# Patient Record
Sex: Male | Born: 1950
Health system: Southern US, Community
[De-identification: ages and names within clinical notes are randomized; demographics above are authoritative.]

## PROBLEM LIST (undated history)

## (undated) DIAGNOSIS — B1920 Unspecified viral hepatitis C without hepatic coma: Secondary | ICD-10-CM

## (undated) DIAGNOSIS — R569 Unspecified convulsions: Secondary | ICD-10-CM

## (undated) DIAGNOSIS — D649 Anemia, unspecified: Secondary | ICD-10-CM

## (undated) DIAGNOSIS — G629 Polyneuropathy, unspecified: Secondary | ICD-10-CM

## (undated) DIAGNOSIS — I10 Essential (primary) hypertension: Secondary | ICD-10-CM

## (undated) DIAGNOSIS — K859 Acute pancreatitis without necrosis or infection, unspecified: Secondary | ICD-10-CM

## (undated) HISTORY — DX: Unspecified viral hepatitis C without hepatic coma: B19.20

## (undated) HISTORY — DX: Essential (primary) hypertension: I10

## (undated) HISTORY — PX: NO PAST SURGERIES: SHX2092

---

## 2018-12-09 ENCOUNTER — Other Ambulatory Visit: Payer: Self-pay

## 2018-12-09 ENCOUNTER — Emergency Department
Admission: EM | Admit: 2018-12-09 | Discharge: 2018-12-09 | Discharge status: Against Medical Advice | Payer: Medicare Other | Attending: Internal Medicine | Admitting: Internal Medicine

## 2018-12-09 ENCOUNTER — Emergency Department: Payer: Medicare Other

## 2018-12-09 ENCOUNTER — Encounter: Payer: Self-pay | Admitting: Emergency Medicine

## 2018-12-09 DIAGNOSIS — Z720 Tobacco use: Secondary | ICD-10-CM | POA: Diagnosis not present

## 2018-12-09 DIAGNOSIS — R1013 Epigastric pain: Secondary | ICD-10-CM

## 2018-12-09 DIAGNOSIS — N3001 Acute cystitis with hematuria: Secondary | ICD-10-CM

## 2018-12-09 DIAGNOSIS — K861 Other chronic pancreatitis: Secondary | ICD-10-CM | POA: Diagnosis not present

## 2018-12-09 DIAGNOSIS — R52 Pain, unspecified: Secondary | ICD-10-CM | POA: Diagnosis not present

## 2018-12-09 DIAGNOSIS — I10 Essential (primary) hypertension: Secondary | ICD-10-CM | POA: Diagnosis not present

## 2018-12-09 DIAGNOSIS — Z5320 Procedure and treatment not carried out because of patient's decision for unspecified reasons: Secondary | ICD-10-CM | POA: Diagnosis not present

## 2018-12-09 DIAGNOSIS — K4090 Unilateral inguinal hernia, without obstruction or gangrene, not specified as recurrent: Secondary | ICD-10-CM

## 2018-12-09 DIAGNOSIS — F17200 Nicotine dependence, unspecified, uncomplicated: Secondary | ICD-10-CM | POA: Diagnosis not present

## 2018-12-09 DIAGNOSIS — R1084 Generalized abdominal pain: Secondary | ICD-10-CM | POA: Diagnosis not present

## 2018-12-09 DIAGNOSIS — R1010 Upper abdominal pain, unspecified: Secondary | ICD-10-CM | POA: Diagnosis not present

## 2018-12-09 DIAGNOSIS — N3000 Acute cystitis without hematuria: Secondary | ICD-10-CM | POA: Diagnosis not present

## 2018-12-09 DIAGNOSIS — R109 Unspecified abdominal pain: Secondary | ICD-10-CM | POA: Diagnosis present

## 2018-12-09 HISTORY — DX: Polyneuropathy, unspecified: G62.9

## 2018-12-09 LAB — CBC WITH DIFFERENTIAL/PLATELET
Abs Immature Granulocytes: 0.08 10*3/uL — ABNORMAL HIGH (ref 0.00–0.07)
Basophils Absolute: 0.1 10*3/uL (ref 0.0–0.1)
Basophils Relative: 1 %
Eosinophils Absolute: 0.1 10*3/uL (ref 0.0–0.5)
Eosinophils Relative: 1 %
HCT: 45 % (ref 39.0–52.0)
Hemoglobin: 15.1 g/dL (ref 13.0–17.0)
Immature Granulocytes: 1 %
Lymphocytes Relative: 17 %
Lymphs Abs: 2.7 10*3/uL (ref 0.7–4.0)
MCH: 23.7 pg — ABNORMAL LOW (ref 26.0–34.0)
MCHC: 33.6 g/dL (ref 30.0–36.0)
MCV: 70.6 fL — ABNORMAL LOW (ref 80.0–100.0)
Monocytes Absolute: 0.5 10*3/uL (ref 0.1–1.0)
Monocytes Relative: 3 %
Neutro Abs: 11.9 10*3/uL — ABNORMAL HIGH (ref 1.7–7.7)
Neutrophils Relative %: 77 %
Platelets: 261 10*3/uL (ref 150–400)
RBC: 6.37 MIL/uL — ABNORMAL HIGH (ref 4.22–5.81)
RDW: 17.4 % — ABNORMAL HIGH (ref 11.5–15.5)
WBC: 15.3 10*3/uL — ABNORMAL HIGH (ref 4.0–10.5)
nRBC: 0.1 % (ref 0.0–0.2)

## 2018-12-09 LAB — URINALYSIS, COMPLETE (UACMP) WITH MICROSCOPIC
BACTERIA UA: NONE SEEN
Bilirubin Urine: NEGATIVE
Glucose, UA: NEGATIVE mg/dL
Ketones, ur: 5 mg/dL — AB
Nitrite: NEGATIVE
PROTEIN: NEGATIVE mg/dL
RBC / HPF: >50 RBC/hpf — ABNORMAL HIGH (ref 0–5)
Specific Gravity, Urine: 1.016 (ref 1.005–1.030)
Squamous Epithelial / HPF: NONE SEEN (ref 0–5)
WBC, UA: >50 WBC/hpf — ABNORMAL HIGH (ref 0–5)
pH: 7 (ref 5.0–8.0)

## 2018-12-09 LAB — COMPREHENSIVE METABOLIC PANEL
ALT: 22 U/L (ref 0–44)
AST: 19 U/L (ref 15–41)
Albumin: 4.5 g/dL (ref 3.5–5.0)
Alkaline Phosphatase: 61 U/L (ref 38–126)
Anion gap: 9 (ref 5–15)
BUN: 13 mg/dL (ref 8–23)
CO2: 23 mmol/L (ref 22–32)
Calcium: 9.3 mg/dL (ref 8.9–10.3)
Chloride: 105 mmol/L (ref 98–111)
Creatinine, Ser: 0.78 mg/dL (ref 0.61–1.24)
GFR calc Af Amer: >60 mL/min (ref >60)
GFR calc non Af Amer: >60 mL/min (ref >60)
Glucose, Bld: 147 mg/dL — ABNORMAL HIGH (ref 70–99)
Potassium: 3.5 mmol/L (ref 3.5–5.1)
Sodium: 137 mmol/L (ref 135–145)
Total Bilirubin: 0.7 mg/dL (ref 0.3–1.2)
Total Protein: 7.7 g/dL (ref 6.5–8.1)

## 2018-12-09 LAB — LIPASE, BLOOD: Lipase: 43 U/L (ref 11–51)

## 2018-12-09 LAB — TROPONIN I: Troponin I: <0.03 ng/mL (ref <0.03)

## 2018-12-09 MED ORDER — SODIUM CHLORIDE 0.9 % IV BOLUS
1000.0000 mL | Freq: Once | INTRAVENOUS | Status: AC
  Administered 2018-12-09: 1000 mL via INTRAVENOUS

## 2018-12-09 MED ORDER — SODIUM CHLORIDE 0.9 % IV SOLN
1.0000 g | Freq: Once | INTRAVENOUS | Status: AC
  Administered 2018-12-09: 1 g via INTRAVENOUS
  Filled 2018-12-09: qty 10

## 2018-12-09 MED ORDER — FAMOTIDINE 20 MG PO TABS: 20.0000 mg | ORAL_TABLET | Freq: Every day | ORAL | 0 refills | Status: AC

## 2018-12-09 MED ORDER — KETOROLAC TROMETHAMINE 30 MG/ML IJ SOLN
15.0000 mg | INTRAMUSCULAR | Status: AC
  Administered 2018-12-09: 15 mg via INTRAVENOUS
  Filled 2018-12-09: qty 1

## 2018-12-09 MED ORDER — SUCRALFATE 1 G PO TABS
1.0000 g | ORAL_TABLET | Freq: Once | ORAL | Status: DC
  Filled 2018-12-09: qty 1

## 2018-12-09 MED ORDER — IOHEXOL 300 MG/ML  SOLN
100.0000 mL | Freq: Once | INTRAMUSCULAR | Status: AC | PRN
  Administered 2018-12-09: 100 mL via INTRAVENOUS

## 2018-12-09 MED ORDER — NICOTINE 21 MG/24HR TD PT24
21.0000 mg | MEDICATED_PATCH | Freq: Once | TRANSDERMAL | Status: DC
  Administered 2018-12-09: 21 mg via TRANSDERMAL
  Filled 2018-12-09: qty 1

## 2018-12-09 MED ORDER — PANTOPRAZOLE SODIUM 40 MG IV SOLR
40.0000 mg | Freq: Once | INTRAVENOUS | Status: AC
  Administered 2018-12-09: 40 mg via INTRAVENOUS
  Filled 2018-12-09: qty 40

## 2018-12-09 MED ORDER — CEPHALEXIN 500 MG PO CAPS: 500.0000 mg | ORAL_CAPSULE | Freq: Three times a day (TID) | ORAL | 0 refills | Status: AC

## 2018-12-09 NOTE — ED Notes (Signed)
Patient remains undecided as to whether he will stay to be admitted or not. Will continue to follow up

## 2018-12-09 NOTE — H&P (Signed)
[facility] [place] [facility]   PATIENT NAME: Tom Pace    MR#:  [id number]  DATE OF BIRTH:  1950-12-08  DATE OF ADMISSION:  12/09/2018  PRIMARY CARE PHYSICIAN: Patient, No Pcp Per   REQUESTING/REFERRING PHYSICIAN: Dr Sharman Cheek  CHIEF COMPLAINT:   Chief Complaint  Patient presents with  . Abdominal Pain    HISTORY OF PRESENT ILLNESS:  Tom Pace  is a 68 y.o. male presents with abdominal pain.  He thought it was his hiatal hernia that is been acting up.  But any position that he goes into does not make it better.  A lot of times when he coughs it can pop out.  Pain was 10 out of 10 when he came in.  He has been having poor appetite and nausea.  Patient's pain is in the epigastric area.  He has been having pain over the last 2 to 3 days.  It got so bad last night that he had come into the hospital.  No weight loss.  No vomiting.  No diarrhea or constipation.  No blood in the bowel movements.  CT scan consistent with chronic pancreatitis and did have a cyst on the pancreas.  Lipase was normal.  Positive for acute cystitis on urine analysis.  Patient not sure if he wants to come into the hospital.  Case discussed with daytime ER physician and they will let me know if the patient wants to come in for an observation.  Otherwise they will send the patient home from the emergency room.  PAST MEDICAL HISTORY:   Past Medical History:  Diagnosis Date  . Neuropathy     PAST SURGICAL HISTORY:   Past Surgical History:  Procedure Laterality Date  . NO PAST SURGERIES      SOCIAL HISTORY:   Social History   Tobacco Use  . Smoking status: Current Every Day Smoker  . Smokeless tobacco: Never Used  Substance Use Topics  . Alcohol use: Not Currently    FAMILY HISTORY:   Family History  Problem Relation Age of Onset  . Diabetes Mother   . Osteoporosis Mother   . Suicidality Father     DRUG ALLERGIES:  No Known Allergies  REVIEW OF  SYSTEMS:  CONSTITUTIONAL: No fever, fatigue or weakness.  EYES: No blurred or double vision.  EARS, NOSE, AND THROAT: No tinnitus or ear pain. No sore throat RESPIRATORY: No cough, shortness of breath, wheezing or hemoptysis.  CARDIOVASCULAR: No chest pain, orthopnea, edema.  GASTROINTESTINAL: Positive nausea, No vomiting. Positive abdominal pain. No blood in bowel movements GENITOURINARY: No dysuria, hematuria.  ENDOCRINE: No polyuria, nocturia,  HEMATOLOGY: No anemia, easy bruising or bleeding SKIN: No rash or lesion. MUSCULOSKELETAL: Positive neuropathy  NEUROLOGIC: No tingling, numbness, weakness.  PSYCHIATRY: Hx depression. No thoughts of hurting himself or others  MEDICATIONS AT HOME:   Prior to Admission medications   Medication Sig Start Date End Date Taking? Authorizing Provider  oxymetazoline (AFRIN) 0.05 % nasal spray Place 1 spray into both nostrils 2 (two) times daily.   Yes [provider]      VITAL SIGNS:  Blood pressure (!) 151/93, pulse 90, temperature 98.2 F (36.8 C), temperature source Oral, resp. rate 20, height 5\' 6"  (1.676 m), weight 65.8 kg, SpO2 100 %.  PHYSICAL EXAMINATION:  GENERAL:  68 y.o.-year-old patient lying in the bed with no acute distress.  EYES: Pupils equal, round, reactive to light and accommodation. No scleral icterus. Extraocular muscles intact.  HEENT: Head atraumatic, normocephalic. Oropharynx and nasopharynx clear.  NECK:  Supple, no jugular venous distention. No thyroid enlargement, no tenderness.  LUNGS: Normal breath sounds bilaterally, no wheezing, rales,rhonchi or crepitation. No use of accessory muscles of respiration.  CARDIOVASCULAR: S1, S2 normal. No murmurs, rubs, or gallops.  ABDOMEN: Soft, slight epigatric tenderness, nondistended. Bowel sounds present. No organomegaly or mass.  EXTREMITIES: No pedal edema, cyanosis, or clubbing.  NEUROLOGIC: Cranial nerves II through XII are intact. Muscle strength 5/5 in all  extremities. Sensation intact. Gait not checked.  PSYCHIATRIC: The patient is alert and oriented x 3.  SKIN: No rash, lesion, or ulcer.   LABORATORY PANEL:   CBC Recent Labs  Lab 12/09/18 0140  WBC 15.3*  HGB 15.1  HCT 45.0  PLT 261   ------------------------------------------------------------------------------------------------------------------  Chemistries  Recent Labs  Lab 12/09/18 0140  NA 137  K 3.5  CL 105  CO2 23  GLUCOSE 147*  BUN 13  CREATININE 0.78  CALCIUM 9.3  AST 19  ALT 22  ALKPHOS 61  BILITOT 0.7   ------------------------------------------------------------------------------------------------------------------  Cardiac Enzymes Recent Labs  Lab 12/09/18 0140  TROPONINI <0.03   ------------------------------------------------------------------------------------------------------------------  RADIOLOGY:  Ct Abdomen Pelvis W Contrast  Result Date: 12/09/2018 CLINICAL DATA:  Mid upper abdominal pain for several months exacerbated by eating EXAM: CT ABDOMEN AND PELVIS WITH CONTRAST TECHNIQUE: Multidetector CT imaging of the abdomen and pelvis was performed using the standard protocol following bolus administration of intravenous contrast. CONTRAST:  OMNIPAQUE IOHEXOL 300 MG/ML  SOLN COMPARISON:  None. FINDINGS: Lower chest:  Negative Hepatobiliary: There are a few tiny low densities in the liver that are much too small for densitometry.No evidence of biliary obstruction or stone. Pancreas: Scattered coarse calcifications. No ductal dilatation or beading. 8 mm low-density lesion over the pancreatic tail that is cystic density on reformats. Spleen: Unremarkable. Adrenals/Urinary Tract: Negative adrenals. No hydronephrosis or ureteral stone. 6.1 cm left upper pole cyst with benign appearing minimal peripheral calcification. There are additional tiny low densities in the kidneys that are likely cystic. Indeterminate lesion in the right renal upper pole  measuring 1 cm. Unremarkable bladder. Stomach/Bowel: No obstruction. No evidence of bowel inflammation. Mild left colonic diverticulosis. Vascular/Lymphatic: Extensive atherosclerotic plaque with generalized narrowing of the bilateral iliacs. Celiac appears to be widely patent on sagittal reformats. There is at least moderate narrowing at the origin of the SMA. No mass or adenopathy. Reproductive:Enlarged prostate with dystrophic calcifications. Other: No ascites or pneumoperitoneum. Musculoskeletal: Advanced L4-5 disc degeneration. L3-4 mild anterolisthesis. IMPRESSION: 1. No acute finding. 2. Pancreatic calcifications suggesting chronic pancreatitis. No evidence of superimposed acute inflammation. 3. Indeterminate 1 cm right renal lesion. Recommend elective MRI with contrast. At the same time, attention to a 8 mm probable cyst in the pancreatic tail. 4. Advanced atherosclerosis. There is SMA origin narrowing but no celiac stenosis. Electronically Signed   By: Marnee Spring M.D.   On: 12/09/2018 04:25    EKG:   Normal sinus rhythm 81 bpm biatrial enlargement  IMPRESSION AND PLAN:   1.  Epigastric abdominal pain.  Patient thinks it could be a hiatal hernia.  Could also be ulcer disease.  Less likely chronic pancreatitis flare secondary to patient feeling better after pain medications.  Patient not sure if he wants to come into the hospital.  ER physician to evaluate.  Recommend PPI.  I offered to get gastroenterology to see him for an endoscopy but the patient is not sure yet. 2.  Acute cystitis, with  leukocytosis.  Given Rocephin.  Follow-up urine culture.  Recommend antibiotic upon going home. 3.  Depression.  Patient does not want to see a psychiatrist. 4.  Tobacco abuse.  Smoking cessation counseling done 4 minutes by me.  Nicotine patch ordered    All the records are reviewed and case discussed with ED provider.  This will be billed as an ER consult if the patient does not want to come into  the hospital. Management plans discussed with the patient, and he is going to decide what he wants to do.  CODE STATUS: Full code  TOTAL TIME TAKING CARE OF THIS PATIENT: 50 minutes.    Alford Highland M.D on 12/09/2018 at 8:24 AM  Between 7am to 6pm - Pager - [phone]  After 6pm call admission pager [phone]  [facility]  [phone]  CC: Primary care physician; Patient, No Pcp Per

## 2018-12-09 NOTE — ED Triage Notes (Signed)
Pt to triage via w/c with no distress noted, brought in by EMS; pt reports mid upper abd pain for several months accomp by nausea with eating

## 2018-12-09 NOTE — ED Provider Notes (Signed)
[facility] Emergency Department Provider Note  ____________________________________________  Time seen: Approximately 6:51 AM  I have reviewed the triage vital signs and the nursing notes.   HISTORY  Chief Complaint Abdominal Pain    HPI Tom GRAUS is a 68 y.o. male with no significant past medical history who complains of epigastric pain for the past 2 to 3 months, worse with eating, associated with nausea.  No vomiting diarrhea or constipation.  No black or bloody stool.  No fevers or chills.  Denies weight loss but does mention that he has noticed generalized muscle atrophy.  Pain is nonradiating.  Denies dysuria but reports occasionally seeing hematuria.      History reviewed. No pertinent past medical history.   There are no active problems to display for this patient.    History reviewed. No pertinent surgical history.   Prior to Admission medications   Not on File  None   Allergies Patient has no known allergies.   No family history on file.  Social History Social History   Tobacco Use  . Smoking status: Current Every Day Smoker  . Smokeless tobacco: Never Used  Substance Use Topics  . Alcohol use: Not on file  . Drug use: Not on file    Review of Systems  Constitutional:   No fever or chills.  ENT:   No sore throat. No rhinorrhea. Cardiovascular:   No chest pain or syncope. Respiratory:   No dyspnea or cough. Gastrointestinal: Positive as above abdominal pain without vomiting and diarrhea.  Musculoskeletal:   Negative for focal pain or swelling All other systems reviewed and are negative except as documented above in ROS and HPI.  ____________________________________________   PHYSICAL EXAM:  VITAL SIGNS: ED Triage Vitals  Enc Vitals Group     BP 12/09/18 0140 (!) 148/101     Pulse Rate 12/09/18 0140 85     Resp 12/09/18 0140 18     Temp 12/09/18 0140 98.2 F (36.8 C)     Temp Source 12/09/18 0140 Oral      SpO2 12/09/18 0140 97 %     Weight 12/09/18 0139 145 lb (65.8 kg)     Height 12/09/18 0139 5\' 6"  (1.676 m)     Head Circumference --      Peak Flow --      Pain Score 12/09/18 0139 10     Pain Loc --      Pain Edu? --      Excl. in GC? --     Vital signs reviewed, nursing assessments reviewed.   Constitutional:   Alert and oriented. Non-toxic appearance. Eyes:   Conjunctivae are normal. EOMI. PERRL. ENT      Head:   Normocephalic and atraumatic.      Nose:   No congestion/rhinnorhea.       Mouth/Throat:   Dry mucous membranes, no pharyngeal erythema. No peritonsillar mass.       Neck:   No meningismus. Full ROM. Hematological/Lymphatic/Immunilogical:   No cervical lymphadenopathy. Cardiovascular:   RRR. Symmetric bilateral radial and DP pulses.  No murmurs. Cap refill less than 2 seconds. Respiratory:   Normal respiratory effort without tachypnea/retractions. Breath sounds are clear and equal bilaterally. No wheezes/rales/rhonchi. Gastrointestinal:   Soft with epigastric tenderness and prominent aortic pulsations.. Non distended. There is no CVA tenderness.  No rebound, rigidity, or guarding.  There is a direct right inguinal hernia that is easily reducible. Genitourinary:   Normal Musculoskeletal:   Normal  range of motion in all extremities. No joint effusions.  No lower extremity tenderness.  No edema. Neurologic:   Normal speech and language.  Motor grossly intact. No acute focal neurologic deficits are appreciated.  Skin:    Skin is warm, dry and intact. No rash noted.  No petechiae, purpura, or bullae.  ____________________________________________    LABS (pertinent positives/negatives) (all labs ordered are listed, but only abnormal results are displayed) Labs Reviewed  CBC WITH DIFFERENTIAL/PLATELET - Abnormal; Notable for the following components:      Result Value   WBC 15.3 (*)    RBC 6.37 (*)    MCV 70.6 (*)    MCH 23.7 (*)    RDW 17.4 (*)    Neutro Abs 11.9  (*)    Abs Immature Granulocytes 0.08 (*)    All other components within normal limits  COMPREHENSIVE METABOLIC PANEL - Abnormal; Notable for the following components:   Glucose, Bld 147 (*)    All other components within normal limits  URINALYSIS, COMPLETE (UACMP) WITH MICROSCOPIC - Abnormal; Notable for the following components:   Color, Urine YELLOW (*)    APPearance HAZY (*)    Hgb urine dipstick MODERATE (*)    Ketones, ur 5 (*)    Leukocytes,Ua LARGE (*)    RBC / HPF >50 (*)    WBC, UA >50 (*)    All other components within normal limits  URINE CULTURE  LIPASE, BLOOD  TROPONIN I   ____________________________________________   EKG  Interpreted by me Normal sinus rhythm rate of 81, normal axis and intervals.  Normal QRS ST segments and T waves.  ____________________________________________    RADIOLOGY  Ct Abdomen Pelvis W Contrast  Result Date: 12/09/2018 CLINICAL DATA:  Mid upper abdominal pain for several months exacerbated by eating EXAM: CT ABDOMEN AND PELVIS WITH CONTRAST TECHNIQUE: Multidetector CT imaging of the abdomen and pelvis was performed using the standard protocol following bolus administration of intravenous contrast. CONTRAST:  100mL OMNIPAQUE IOHEXOL 300 MG/ML  SOLN COMPARISON:  None. FINDINGS: Lower chest:  Negative Hepatobiliary: There are a few tiny low densities in the liver that are much too small for densitometry.No evidence of biliary obstruction or stone. Pancreas: Scattered coarse calcifications. No ductal dilatation or beading. 8 mm low-density lesion over the pancreatic tail that is cystic density on reformats. Spleen: Unremarkable. Adrenals/Urinary Tract: Negative adrenals. No hydronephrosis or ureteral stone. 6.1 cm left upper pole cyst with benign appearing minimal peripheral calcification. There are additional tiny low densities in the kidneys that are likely cystic. Indeterminate lesion in the right renal upper pole measuring 1 cm.  Unremarkable bladder. Stomach/Bowel: No obstruction. No evidence of bowel inflammation. Mild left colonic diverticulosis. Vascular/Lymphatic: Extensive atherosclerotic plaque with generalized narrowing of the bilateral iliacs. Celiac appears to be widely patent on sagittal reformats. There is at least moderate narrowing at the origin of the SMA. No mass or adenopathy. Reproductive:Enlarged prostate with dystrophic calcifications. Other: No ascites or pneumoperitoneum. Musculoskeletal: Advanced L4-5 disc degeneration. L3-4 mild anterolisthesis. IMPRESSION: 1. No acute finding. 2. Pancreatic calcifications suggesting chronic pancreatitis. No evidence of superimposed acute inflammation. 3. Indeterminate 1 cm right renal lesion. Recommend elective MRI with contrast. At the same time, attention to a 8 mm probable cyst in the pancreatic tail. 4. Advanced atherosclerosis. There is SMA origin narrowing but no celiac stenosis. Electronically Signed   By: Marnee SpringJonathon  Watts M.D.   On: 12/09/2018 04:25    ____________________________________________   PROCEDURES Procedures  ____________________________________________  DIFFERENTIAL  DIAGNOSIS  Pancreatitis, cholecystitis, choledocholithiasis, intra-abdominal tumor, AAA, GERD  CLINICAL IMPRESSION / ASSESSMENT AND PLAN / ED COURSE  Medications ordered in the ED: Medications  sucralfate (CARAFATE) tablet 1 g (1 g Oral Refused 12/09/18 0456)  nicotine (NICODERM CQ - dosed in mg/24 hours) patch 21 mg (has no administration in time range)  ketorolac (TORADOL) 30 MG/ML injection 15 mg (15 mg Intravenous Given 12/09/18 0431)  sodium chloride 0.9 % bolus 1,000 mL (0 mLs Intravenous Stopped 12/09/18 0534)  iohexol (OMNIPAQUE) 300 MG/ML solution 100 mL (100 mLs Intravenous Contrast Given 12/09/18 0404)  pantoprazole (PROTONIX) injection 40 mg (40 mg Intravenous Given 12/09/18 0455)  cefTRIAXone (ROCEPHIN) 1 g in sodium chloride 0.9 % 100 mL IVPB (0 g Intravenous Stopped  12/09/18 0532)    Pertinent labs & imaging results that were available during my care of the patient were reviewed by me and considered in my medical decision making (see chart for details).    Patient presents with chronic intermittent epigastric pain.  Prior to the last few months this is never happened before.  Labs unremarkable except urinalysis which which shows a large amount of red blood cells or white blood cells, concerning for occult infection.  No evidence of pyelonephritis, not septic.  CT scan obtained which shows chronic pancreatitis as well as a 1 cm renal cyst.  Due to the ongoing symptoms and no clear explanation and his lack of outpatient follow-up (he reports that the PCP his insurance plan assigned him to is not currently accepting new patients), I think it prudent to hospitalize him for symptom control and further work-up.  CT finding of SMA stenosis I think is not clinically significant at this time.    ----------------------------------------- 6:57 AM on 12/09/2018 -----------------------------------------  Discussed with Dr. Arville Care hospitalist      ____________________________________________   FINAL CLINICAL IMPRESSION(S) / ED DIAGNOSES    Final diagnoses:  Epigastric pain  Chronic pancreatitis, unspecified pancreatitis type [facility])     ED Discharge Orders    None      Portions of this note were generated with dragon dictation software. Dictation errors may occur despite best attempts at proofreading.   Sharman Cheek, MD 12/09/18 [phone]

## 2018-12-09 NOTE — ED Notes (Signed)
Dr. Stafford at bedside.  

## 2018-12-09 NOTE — ED Provider Notes (Signed)
_________________________ 9:12 AM on 12/09/2018 -----------------------------------------  Patient admitted by Dr. staff for for UTI and acute on chronic pancreatitis.  Patient was evaluated by Dr. Hennie Duos for admission however at that time patient was requesting to be discharged.  Patient wishes to be discharged AGAINST MEDICAL ADVICE.  Does not want to stay in the hospital at this time since he feels markedly improved.  He is tolerating p.o. with no episodes of vomiting.  Patient received Rocephin for UTI.  Will send him home on Pepcid, bland diet, Keflex for UTI and possible GERD/gastritis.  Recommended follow-up with his primary care doctor.  Discussed in a return precautions.   Don Perking, [place], MD 12/09/18 [phone]

## 2018-12-11 ENCOUNTER — Other Ambulatory Visit: Payer: Self-pay

## 2018-12-11 ENCOUNTER — Emergency Department
Admission: EM | Admit: 2018-12-11 | Discharge: 2018-12-11 | Disposition: A | Discharge status: Home / Self Care | Payer: Medicare Other | Attending: Emergency Medicine | Admitting: Emergency Medicine

## 2018-12-11 ENCOUNTER — Encounter: Payer: Self-pay | Admitting: *Deleted

## 2018-12-11 DIAGNOSIS — R001 Bradycardia, unspecified: Secondary | ICD-10-CM

## 2018-12-11 DIAGNOSIS — F17200 Nicotine dependence, unspecified, uncomplicated: Secondary | ICD-10-CM | POA: Insufficient documentation

## 2018-12-11 DIAGNOSIS — R110 Nausea: Secondary | ICD-10-CM | POA: Diagnosis not present

## 2018-12-11 DIAGNOSIS — R1013 Epigastric pain: Secondary | ICD-10-CM | POA: Diagnosis present

## 2018-12-11 LAB — COMPREHENSIVE METABOLIC PANEL
ALT: 25 U/L (ref 0–44)
ANION GAP: 10 (ref 5–15)
AST: 22 U/L (ref 15–41)
Albumin: 4.3 g/dL (ref 3.5–5.0)
Alkaline Phosphatase: 61 U/L (ref 38–126)
BUN: 18 mg/dL (ref 8–23)
CO2: 23 mmol/L (ref 22–32)
Calcium: 9.5 mg/dL (ref 8.9–10.3)
Chloride: 107 mmol/L (ref 98–111)
Creatinine, Ser: 0.72 mg/dL (ref 0.61–1.24)
GFR calc Af Amer: >60 mL/min (ref >60)
GFR calc non Af Amer: >60 mL/min (ref >60)
Glucose, Bld: 98 mg/dL (ref 70–99)
Potassium: 3.6 mmol/L (ref 3.5–5.1)
Sodium: 140 mmol/L (ref 135–145)
Total Bilirubin: 1 mg/dL (ref 0.3–1.2)
Total Protein: 8 g/dL (ref 6.5–8.1)

## 2018-12-11 LAB — CBC
HCT: 44.7 % (ref 39.0–52.0)
Hemoglobin: 14.9 g/dL (ref 13.0–17.0)
MCH: 23.7 pg — ABNORMAL LOW (ref 26.0–34.0)
MCHC: 33.3 g/dL (ref 30.0–36.0)
MCV: 71.1 fL — ABNORMAL LOW (ref 80.0–100.0)
NRBC: 0 % (ref 0.0–0.2)
Platelets: 258 10*3/uL (ref 150–400)
RBC: 6.29 MIL/uL — AB (ref 4.22–5.81)
RDW: 17.1 % — ABNORMAL HIGH (ref 11.5–15.5)
WBC: 15.1 10*3/uL — ABNORMAL HIGH (ref 4.0–10.5)

## 2018-12-11 LAB — URINE CULTURE: Culture: 10000 — AB

## 2018-12-11 LAB — LIPASE, BLOOD: Lipase: 33 U/L (ref 11–51)

## 2018-12-11 MED ORDER — HYDROMORPHONE HCL 1 MG/ML IJ SOLN
1.0000 mg | Freq: Once | INTRAMUSCULAR | Status: AC
  Administered 2018-12-11: 1 mg via INTRAVENOUS
  Filled 2018-12-11: qty 1

## 2018-12-11 MED ORDER — OXYCODONE HCL 5 MG PO TABS: 5.0000 mg | ORAL_TABLET | Freq: Four times a day (QID) | ORAL | 0 refills | Status: AC | PRN

## 2018-12-11 MED ORDER — SODIUM CHLORIDE 0.9 % IV BOLUS
1000.0000 mL | Freq: Once | INTRAVENOUS | Status: AC
  Administered 2018-12-11: 1000 mL via INTRAVENOUS

## 2018-12-11 MED ORDER — ONDANSETRON 4 MG PO TBDP: 4.0000 mg | ORAL_TABLET | Freq: Three times a day (TID) | ORAL | 0 refills | Status: AC | PRN

## 2018-12-11 MED ORDER — ONDANSETRON HCL 4 MG/2ML IJ SOLN
4.0000 mg | Freq: Once | INTRAMUSCULAR | Status: AC
  Administered 2018-12-11: 4 mg via INTRAVENOUS
  Filled 2018-12-11: qty 2

## 2018-12-11 NOTE — ED Notes (Addendum)
Upon review of the chart admission was recommended but pt refused. Pt was dx with pancreatitis. RN asked pt about this and he stated, "oh yea they did say that, I am just sure it has to be a hernia."

## 2018-12-11 NOTE — Discharge Instructions (Addendum)
The day her heart rate is a little bit low.  This may be normal for you.  However, if you develop lightheadedness or fainting, shortness of breath, chest pain or palpitations, please see your doctor, cardiologist, or if you feel the symptoms are severe come back to the emergency department.  Please take a PPI and pancreatic enzymes as instructed by your GI physicians assistant today.  Please make a follow-up as discussed as well.  In addition, you may take Tylenol but avoid all NSAID medications including ibuprofen for mild to moderate pain.  If you have severe pain you may take oxycodone.  Do not drive within 8 hours of taking oxycodone.  Please follow the dietary instructions as follows: Take a clear liquid diet for the next 3 days, then advance to a full diet for 2 days, then advance to a bland diet as tolerated.  If you begin having pain again, go back to the clear liquid diet.  Please continue to take the Keflex for your urinary tract infection as directed.  Return to the emergency department if you develop uncontrolled abdominal pain despite medications, inability to keep down fluids, fever, or any other symptoms concerning to you.

## 2018-12-11 NOTE — ED Provider Notes (Signed)
[facility] Emergency Department Provider Note  ____________________________________________  Time seen: Approximately 12:22 PM  I have reviewed the triage vital signs and the nursing notes.   HISTORY  Chief Complaint Abdominal Pain and Nausea    HPI Tom Pace is a 68 y.o. male sent from the GI clinic for epigastric pain and nausea.  The patient was seen and evaluated 12/09/2018 and diagnosed with pancreatitis as well as a UTI; he left AGAINST MEDICAL ADVICE at the time of admission.  He states that he is been doing well at home, has been taking Keflex for his urinary tract infection and Pepcid for his epigastric pain.  This morning, he had a single sneeze and this resulted in a severe epigastric pain associate with nausea but no vomiting that has not gone away.  He went for evaluation at the [facility] clinic today and was referred to the emergency department.  He has not had any fevers or chills, cough or cold symptoms, constipation or diarrhea.  He denies alcohol use.  Past Medical History:  Diagnosis Date  . Neuropathy     There are no active problems to display for this patient.   Past Surgical History:  Procedure Laterality Date  . NO PAST SURGERIES      Current Outpatient Rx  . Order #: [id number] Class: Print  . Order #: [id number] Class: Print  . Order #: [id number] Class: Historical Med    Allergies Patient has no known allergies.  Family History  Problem Relation Age of Onset  . Diabetes Mother   . Osteoporosis Mother   . Suicidality Father     Social History Social History   Tobacco Use  . Smoking status: Current Every Day Smoker  . Smokeless tobacco: Never Used  Substance Use Topics  . Alcohol use: Not Currently  . Drug use: Not Currently    Review of Systems Constitutional: No fever/chills.  Lightheadedness or syncope. Eyes: No visual changes. ENT: No sore throat. No congestion or rhinorrhea. Cardiovascular: Denies chest  pain. Denies palpitations. Respiratory: Denies shortness of breath.  No cough. Gastrointestinal: Positive epigastric abdominal pain.  Positive nausea, no vomiting.  No diarrhea.  No constipation. Genitourinary: Positive under treatment with Keflex for UTI. Musculoskeletal: Negative for back pain. Skin: Negative for rash. Neurological: Negative for headaches. No focal numbness, tingling or weakness.     ____________________________________________   PHYSICAL EXAM:  VITAL SIGNS: ED Triage Vitals  Enc Vitals Group     BP 12/11/18 1127 (!) 164/100     Pulse Rate 12/11/18 1127 77     Resp 12/11/18 1127 16     Temp 12/11/18 1127 98.7 F (37.1 C)     Temp Source 12/11/18 1127 Oral     SpO2 12/11/18 1127 100 %     Weight 12/11/18 1128 145 lb (65.8 kg)     Height 12/11/18 1128 5\' 6"  (1.676 m)     Head Circumference --      Peak Flow --      Pain Score 12/11/18 1127 9     Pain Loc --      Pain Edu? --      Excl. in GC? --     Constitutional: Alert and oriented.Answers questions appropriately. Eyes: Conjunctivae are normal.  EOMI. No scleral icterus. Head: Atraumatic. Nose: No congestion/rhinnorhea. Mouth/Throat: Mucous membranes are moist.  Neck: No stridor.  Supple.  No meningismus. Cardiovascular: Normal rate, regular rhythm. No murmurs, rubs or gallops.  Respiratory: Normal respiratory effort.  No  accessory muscle use or retractions. Lungs CTAB.  No wheezes, rales or ronchi. Gastrointestinal: Soft, and nondistended.  Intimal tenderness to palpation in the epigastrium.  Negative Murphy sign.  No guarding or rebound.  No peritoneal signs. Musculoskeletal: No LE edema.Neurologic:  A&Ox3.  Speech is clear.  Face and smile are symmetric.  EOMI.  Moves all extremities well. Skin:  Skin is warm, dry and intact. No rash noted. Psychiatric: Mood and affect are normal.   ____________________________________________   LABS (all labs ordered are listed, but only abnormal results are  displayed)  Labs Reviewed  CBC - Abnormal; Notable for the following components:      Result Value   WBC 15.1 (*)    RBC 6.29 (*)    MCV 71.1 (*)    MCH 23.7 (*)    RDW 17.1 (*)    All other components within normal limits  LIPASE, BLOOD  COMPREHENSIVE METABOLIC PANEL  URINALYSIS, COMPLETE (UACMP) WITH MICROSCOPIC   ____________________________________________  EKG  ED ECG REPORT I, Anne-Caroline Sharma Covert, the attending physician, personally viewed and interpreted this ECG.   Date: 12/11/2018  EKG Time: 1259  Rate: 51  Rhythm: sinus bradycardia  Axis: normal  Intervals:none  ST&T Change: No STEMI  ____________________________________________  RADIOLOGY  No results found.  ____________________________________________   PROCEDURES  Procedure(s) performed: None  Procedures  Critical Care performed: No ____________________________________________   INITIAL IMPRESSION / ASSESSMENT AND PLAN / ED COURSE  Pertinent labs & imaging results that were available during my care of the patient were reviewed by me and considered in my medical decision making (see chart for details).  68 y.o. male with recent diagnosis of acute on chronic pancreatitis and UTI presenting after epigastric pain with nausea and vomiting which restarted this morning.  Check basic laboratory studies and initiate symptomatic treatment, and I will talk to the GI physicians to make a final disposition plan.  ----------------------------------------- 1:04 PM on 12/11/2018 -----------------------------------------  The patient's work-up in the emergency department shows a white blood cell count of 15.1 which is unchanged compared to his prior visit.  His electrolytes are reassuring.  His lipase is 33 today.  Results of his UA.  I have made a consult and call to GI and I am waiting back to hear from them.  Patient does have sinus bradycardia on his EKG and I do not see that he is on a beta-blocker.   His last heart rate when he was seen here prior was 85.  I have let him know that his heart rate is a little low, and that this may be normal for him, but that if he develops any lightheadedness, shortness of breath, syncope, or chest pain he needs to be evaluated by a primary care physician, cardiologist, or if he feels his symptoms are severe that he needs to return to the emergency department.  ----------------------------------------- 1:31 PM on 12/11/2018 -----------------------------------------  I have spoken with Dr. Daleen Squibb, the GI physician on-call.  He is not in the group that follows this gentleman, but I have reviewed the patient's history and findings today and he is in agreement with the plan for discharge.  The patient is tolerating liquids at this time and his pain has improved.  I will have had a thorough discussion with the patient about his pain management plan.  This includes Tylenol as needed for mild to moderate pain and avoidance of all NSAID medications.  In addition, he will need to continue to take his PPI and  pancreatic enzyme as prescribed by his GI PA today.  He will be prescribed Zofran for any nausea.  He will follow-up with his GI team.  The patient has not given a urine specimen at this time but states he is only taken 1 dose of his antibiotic; I have reviewed his microbiology which did show a pansensitive staph infection.  I anticipate the Keflex will be appropriate treatment and have asked him to continue taking this medication; will not repeat his UA today.  At this time the patient is safe for discharge home.  Follow-up instructions as well as return precautions were discussed.  ____________________________________________  FINAL CLINICAL IMPRESSION(S) / ED DIAGNOSES  Final diagnoses:  Sinus bradycardia  Epigastric pain  Nausea without vomiting         NEW MEDICATIONS STARTED DURING THIS VISIT:  New Prescriptions   No medications on file      Rockne Menghini, MD 12/11/18 1333

## 2018-12-11 NOTE — ED Notes (Signed)
Patient reports abd pain x few days, labs drawn and sent. EKG given to Md. meds given as ordered.

## 2018-12-11 NOTE — ED Triage Notes (Signed)
Brought by [facility] for abd pain and wretching.  Says he coughed about 7am and then had intense pain in upper mid abd.  Says he has had this many times over the past several months.

## 2018-12-11 NOTE — ED Triage Notes (Signed)
Pt to ED from home reporting upper abd pain that has been intermittent for the past 8 months. Pain and nausea worsen after coughing and pt reports feeling a "bulge like a hernia" after coughing. Pain worsened this Monday and pt verbalized having to call EMS. Pt was evaluated in ED and was unable to tell this RN a particular dx. Pain continues to worsen today with persistant nausea.

## 2018-12-12 ENCOUNTER — Other Ambulatory Visit: Payer: Self-pay | Admitting: *Deleted

## 2018-12-12 NOTE — Patient Outreach (Addendum)
[facility]) Care Management  12/12/2018  Tom Pace 14-Sep-1951 [id number]   Subjective: Telephone call to patient's home number, spoke with patient, and HIPAA verified.  Discussed [facility] Care Management [employer] Referral follow up, patient voiced understanding, and is in agreement to follow up.  Patient states he is feeling much better, had a ED visit yesterday for pancreatitis, and was able to drive himself home from the emergency room.  States he remember speaking with the referral source, he is  planning to contact [employer] for a list of in network primary care providers that are accepting new patients, do not need  assistance at this time with finding a primary MD, and will notify [facility] Care Management if assistance needed. Discussed importance of hospital follow up with primary MD, patient voices understanding, and states he will follow up as appropriate.  Patient states he has been in the area for approximately 14 years, stays to himself, does not have friends/ family that are close by, when he has a flare with pancreatitis, may need assistance with transportation to appointments, has his own car, and is in agreement to referral to [facility] Care Management Social Worker for transportation assistance.   Patient also scored 18 on PHQ-9, in agreement to a referral to [facility] Care Management Social Worker for mental health resource identification and follow up.  Patient will also discuss mental health resource needs with primary MD once care established.  States he periodically has balance issues at time due to neuropathy and is planning to discuss with primary provider when care established.  Patient states he does not have any education material, transition of care, care coordination, disease management, disease monitoring, transportation, community resource, or pharmacy needs at this time.  States he is very appreciative of the follow up and is in agreement to receive [facility] Care  Management services. RNCM advised patient to notify MD of any changes in condition prior to scheduled appointment. RNCM provided contact name and number: [phone] or main office number [phone] and 24 hour nurse advise line [phone].      Objective:  Per KPN (Knowledge Performance Now, point of care tool) and chart review, patient had ED visit on 12/09/2018 for pancreatitis.  Patient had ED visit on 12/11/2018 for sinus bradycardia, Epigastric pain and nausea.     Patient has a history of neuropathy.        Assessment: Received [employer] Management referral on 12/11/2018.   Referral source: Clydene Fake.    Referral reason: Transportation assistance and has been given contact information for 2 clinic's to follow up to obtain primary MD.   Screening follow up completed and will refer patient to [facility] Care Management Social Worker for transportation assistance and Corporate investment banker.     Plan: RNCM will refer patient to [facility] Care Management Social Worker for transportation assistance and Corporate investment banker. RNCM will send patient Advanced Directives packet per his request.     Zara Wendt H. Gardiner Barefoot, BSN, CCM [facility] Care Management [facility] Telephonic CM Phone: [phone] Fax: [phone]

## 2018-12-16 ENCOUNTER — Telehealth: Payer: Self-pay | Admitting: *Deleted

## 2018-12-16 ENCOUNTER — Other Ambulatory Visit: Payer: Self-pay | Admitting: *Deleted

## 2018-12-16 NOTE — Patient Outreach (Signed)
[facility]) Care Management  12/16/2018  Tom Pace Jan 02, 1951 [id number]   CSW received referral from [facility] RNCM, Darlene for transportation assistance. Patient also agreeable to mental health resource identification and follow up. CSW called & spoke with patient who states that though he does still have a car and is able to get around to run errands & go to doctors appointments on his own, he does forsee that with his neuropathy, that he will likely need assistance with transportation in the future. CSW spoke with patient about community resources such as Biomedical scientist ([employer]) and benefits through his insurance [facility] [employer]) - CSW will have resources mailed to patient.   Patient shared that he does have his initial PCP appointment with Dr. Dierdre Harness on [address] tomorrow, 3/25 at 1:00pm and plans to discuss mental health issues with him then, but he states that he has noticed that his attitude isn't great anymore because all he deals with is health issues, states that he "has no life". CSW provided supportive listening and offered to check in with him on a routine basis but patient declined at this time, he was however open to CSW checking back in 1 month to make sure resources have been received and will discuss it then.    [name], LCSW [facility]  Clinical Social Worker cell #: [phone]

## 2018-12-16 NOTE — Patient Outreach (Signed)
[facility]) Care Management  12/16/2018  Tom Pace 20-Dec-1950 [id number]   Subjective: Received voicemail message from Beverly Milch, states he has a question regarding someone setting up a primary care provider appointment, and requested call back. Telephone call to patient's home number, spoke with patient, and HIPAA verified.  Discussed [facility] Care Management [employer] Util[employer] Management referral follow up, patient voiced understanding, and is in agreement to follow up.    Patient states he is doing okay, remembers speaking with this RNCM in the past, and advised this RNCM do not arrange primary provider visit.   States he has confirmed primary provider visit scheduled for 12/17/2018 and the Utilization Management nurse may have set it up for him.  States the pancreatitis and other health issues are really bothering him, and he is planning to follow up to establish care with new primary care provider on 12/17/2018.  Patient aware of signs / symptoms to report to provider and to schedule sooner appointment if needed.  Patient states he does not have any education material, transition of care, care coordination, disease management, disease monitoring, transportation, or pharmacy needs at this time.  States he is very appreciative of the follow up and is in agreement to receive [facility] Care Management services.    Objective:  Per KPN (Knowledge Performance Now, point of care tool) and chart review, patient had ED visit on 12/09/2018 for pancreatitis.  Patient had ED visit on 12/11/2018 for sinus bradycardia, Epigastric pain and nausea.     Patient has a history of neuropathy.        Assessment: Received [employer] Management referral on 12/11/2018.   Referral source: Clydene Fake.    Referral reason: Transportation assistance and has been given contact information for 2 clinic's to follow up to obtain primary MD.   Screening follow up completed  and patient has been referred to [facility] Care Management Social Worker for transportation assistance and Corporate investment banker.     Plan: RNCM has referred patient to [facility] Care Management Social Worker for transportation assistance and Corporate investment banker. RNCM has sent patient Advanced Directives packet per his request.      An Schnabel H. Gardiner Barefoot, BSN, CCM [facility] Care Management [facility] Telephonic CM Phone: [phone] Fax: [phone]

## 2018-12-19 ENCOUNTER — Other Ambulatory Visit: Payer: Self-pay | Admitting: Gastroenterology

## 2018-12-19 DIAGNOSIS — K861 Other chronic pancreatitis: Secondary | ICD-10-CM

## 2018-12-19 DIAGNOSIS — R110 Nausea: Secondary | ICD-10-CM

## 2019-01-16 ENCOUNTER — Other Ambulatory Visit: Payer: Self-pay | Admitting: *Deleted

## 2019-01-19 NOTE — Patient Outreach (Signed)
[facility]) Care Management  01/19/2019  Tom Pace 09-25-1950 [id number]   CSW attempted to reach patient to follow-up and make sure he had received the resources mailed to him but patient's phone went straight to voicemail. CSW was however, able to leave a HIPPA compliant voicemail. CSW will try again in 4 days unless patient calls back in the meantime.    [name], LCSW [facility]  Clinical Social Worker cell #: [phone]

## 2019-01-23 ENCOUNTER — Ambulatory Visit: Payer: Self-pay | Admitting: *Deleted

## 2019-01-27 ENCOUNTER — Ambulatory Visit: Admission: RE | Admit: 2019-01-27 | Payer: Medicare Other | Source: Ambulatory Visit

## 2019-01-27 ENCOUNTER — Ambulatory Visit: Payer: Medicare Other

## 2019-02-25 DIAGNOSIS — K861 Other chronic pancreatitis: Secondary | ICD-10-CM | POA: Insufficient documentation

## 2019-04-03 ENCOUNTER — Other Ambulatory Visit: Payer: Self-pay

## 2019-04-03 DIAGNOSIS — N1330 Unspecified hydronephrosis: Secondary | ICD-10-CM

## 2019-04-07 ENCOUNTER — Encounter: Payer: Self-pay | Admitting: Urology

## 2019-04-07 ENCOUNTER — Ambulatory Visit (INDEPENDENT_AMBULATORY_CARE_PROVIDER_SITE_OTHER): Payer: Medicare Other | Admitting: Urology

## 2019-04-07 ENCOUNTER — Other Ambulatory Visit
Admission: RE | Admit: 2019-04-07 | Discharge: 2019-04-07 | Disposition: A | Discharge status: Home / Self Care | Payer: Medicare Other | Attending: Urology | Admitting: Urology

## 2019-04-07 ENCOUNTER — Other Ambulatory Visit: Payer: Self-pay

## 2019-04-07 VITALS — BP 151/92 | HR 85 | Ht 66.0 in | Wt 150.0 lb

## 2019-04-07 DIAGNOSIS — N1330 Unspecified hydronephrosis: Secondary | ICD-10-CM | POA: Insufficient documentation

## 2019-04-07 DIAGNOSIS — R310 Gross hematuria: Secondary | ICD-10-CM | POA: Diagnosis not present

## 2019-04-07 LAB — URINALYSIS, COMPLETE (UACMP) WITH MICROSCOPIC
Bacteria, UA: NONE SEEN
Bilirubin Urine: NEGATIVE
Glucose, UA: NEGATIVE mg/dL
Ketones, ur: NEGATIVE mg/dL
Nitrite: NEGATIVE
Protein, ur: NEGATIVE mg/dL
Specific Gravity, Urine: 1.01 (ref 1.005–1.030)
Squamous Epithelial / HPF: NONE SEEN (ref 0–5)
pH: 5.5 (ref 5.0–8.0)

## 2019-04-07 LAB — BLADDER SCAN AMB NON-IMAGING

## 2019-04-07 NOTE — Patient Instructions (Signed)
Hematuria, Adult Hematuria is blood in the urine. Blood may be visible in the urine, or it may be identified with a test. This condition can be caused by infections of the bladder, urethra, kidney, or prostate. Other possible causes include:  Kidney stones.  Cancer of the urinary tract.  Too much calcium in the urine.  Conditions that are passed from parent to child (inherited conditions).  Exercise that requires a lot of energy. Infections can usually be treated with medicine, and a kidney stone usually will pass through your urine. If neither of these is the cause of your hematuria, more tests may be needed to identify the cause of your symptoms. It is very important to tell your health care provider about any blood in your urine, even if it is painless or the blood stops without treatment. Blood in the urine, when it happens and then stops and then happens again, can be a symptom of a very serious condition, including cancer. There is no pain in the initial stages of many urinary cancers. Follow these instructions at home: Medicines  Take over-the-counter and prescription medicines only as told by your health care provider.  If you were prescribed an antibiotic medicine, take it as told by your health care provider. Do not stop taking the antibiotic even if you start to feel better. Eating and drinking  Drink enough fluid to keep your urine clear or pale yellow. It is recommended that you drink 3-4 quarts (2.8-3.8 L) a day. If you have been diagnosed with an infection, it is recommended that you drink cranberry juice in addition to large amounts of water.  Avoid caffeine, tea, and carbonated beverages. These tend to irritate the bladder.  Avoid alcohol because it may irritate the prostate (men). General instructions  If you have been diagnosed with a kidney stone, follow your health care provider's instructions about straining your urine to catch the stone.  Empty your bladder  often. Avoid holding urine for long periods of time.  If you are male: ? After a bowel movement, wipe from front to back and use each piece of toilet paper only once. ? Empty your bladder before and after sex.  Pay attention to any changes in your symptoms. Tell your health care provider about any changes or any new symptoms.  It is your responsibility to get your test results. Ask your health care provider, or the department performing the test, when your results will be ready.  Keep all follow-up visits as told by your health care provider. This is important. Contact a health care provider if:  You develop back pain.  You have a fever.  You have nausea or vomiting.  Your symptoms do not improve after 3 days.  Your symptoms get worse. Get help right away if:  You develop severe vomiting and are unable take medicine without vomiting.  You develop severe pain in your back or abdomen even though you are taking medicine.  You pass a large amount of blood in your urine.  You pass blood clots in your urine.  You feel very weak or like you might faint.  You faint. Summary  Hematuria is blood in the urine. It has many possible causes.  It is very important that you tell your health care provider about any blood in your urine, even if it is painless or the blood stops without treatment.  Take over-the-counter and prescription medicines only as told by your health care provider.  Drink enough fluid to keep   your urine clear or pale yellow. This information is not intended to replace advice given to you by your health care provider. Make sure you discuss any questions you have with your health care provider. Document Released: 09/10/2005 Document Revised: 08/23/2017 Document Reviewed: 10/13/2016 Elsevier Patient Education  2020 [employer].   Cystoscopy Cystoscopy is a procedure that is used to help diagnose and sometimes treat conditions that affect the lower urinary  tract. The lower urinary tract includes the bladder and the urethra. The urethra is the tube that drains urine from the bladder. Cystoscopy is done using a thin, tube-shaped instrument with a light and camera at the end (cystoscope). The cystoscope may be hard or flexible, depending on the goal of the procedure. The cystoscope is inserted through the urethra, into the bladder. Cystoscopy may be recommended if you have:  Urinary tract infections that keep coming back.  Blood in the urine (hematuria).  An inability to control when you urinate (urinary incontinence) or an overactive bladder.  Unusual cells found in a urine sample.  A blockage in the urethra, such as a urinary stone.  Painful urination.  An abnormality in the bladder found during an intravenous pyelogram (IVP) or CT scan. Cystoscopy may also be done to remove a sample of tissue to be examined under a microscope (biopsy). Tell a health care provider about:  Any allergies you have.  All medicines you are taking, including vitamins, herbs, eye drops, creams, and over-the-counter medicines.  Any problems you or family members have had with anesthetic medicines.  Any blood disorders you have.  Any surgeries you have had.  Any medical conditions you have.  Whether you are pregnant or may be pregnant. What are the risks? Generally, this is a safe procedure. However, problems may occur, including:  Infection.  Bleeding.  Allergic reactions to medicines.  Damage to other structures or organs. What happens before the procedure?  Ask your health care provider about: ? Changing or stopping your regular medicines. This is especially important if you are taking diabetes medicines or blood thinners. ? Taking medicines such as aspirin and ibuprofen. These medicines can thin your blood. Do not take these medicines unless your health care provider tells you to take them. ? Taking over-the-counter medicines, vitamins, herbs,  and supplements.  Follow instructions from your health care provider about eating or drinking restrictions.  Ask your health care provider what steps will be taken to help prevent infection. These may include: ? Washing skin with a germ-killing soap. ? Taking antibiotic medicine.  You may have an exam or testing, such as: ? X-rays of the bladder, urethra, or kidneys. ? Urine tests to check for signs of infection.  Plan to have someone take you home from the hospital or clinic. What happens during the procedure?   You will be given one or more of the following: ? A medicine to help you relax (sedative). ? A medicine to numb the area (local anesthetic).  The area around the opening of your urethra will be cleaned.  The cystoscope will be passed through your urethra into your bladder.  Germ-free (sterile) fluid will flow through the cystoscope to fill your bladder. The fluid will stretch your bladder so that your health care provider can clearly examine your bladder walls.  Your doctor will look at the urethra and bladder. Your doctor may take a biopsy or remove stones.  The cystoscope will be removed, and your bladder will be emptied. The procedure may vary among  health care providers and hospitals. What can I expect after the procedure? After the procedure, it is common to have:  Some soreness or pain in your abdomen and urethra.  Urinary symptoms. These include: ? Mild pain or burning when you urinate. Pain should stop within a few minutes after you urinate. This may last for up to 1 week. ? A small amount of blood in your urine for several days. ? Feeling like you need to urinate but producing only a small amount of urine. Follow these instructions at home: Medicines  Take over-the-counter and prescription medicines only as told by your health care provider.  If you were prescribed an antibiotic medicine, take it as told by your health care provider. Do not stop taking  the antibiotic even if you start to feel better. General instructions  Return to your normal activities as told by your health care provider. Ask your health care provider what activities are safe for you.  Do not drive for 24 hours if you were given a sedative during your procedure.  Watch for any blood in your urine. If the amount of blood in your urine increases, call your health care provider.  Follow instructions from your health care provider about eating or drinking restrictions.  If a tissue sample was removed for testing (biopsy) during your procedure, it is up to you to get your test results. Ask your health care provider, or the department that is doing the test, when your results will be ready.  Drink enough fluid to keep your urine pale yellow.  Keep all follow-up visits as told by your health care provider. This is important. Contact a health care provider if you:  Have pain that gets worse or does not get better with medicine, especially pain when you urinate.  Have trouble urinating.  Have more blood in your urine. Get help right away if you:  Have blood clots in your urine.  Have abdominal pain.  Have a fever or chills.  Are unable to urinate. Summary  Cystoscopy is a procedure that is used to help diagnose and sometimes treat conditions that affect the lower urinary tract.  Cystoscopy is done using a thin, tube-shaped instrument with a light and camera at the end.  After the procedure, it is common to have some soreness or pain in your abdomen and urethra.  Watch for any blood in your urine. If the amount of blood in your urine increases, call your health care provider.  If you were prescribed an antibiotic medicine, take it as told by your health care provider. Do not stop taking the antibiotic even if you start to feel better. This information is not intended to replace advice given to you by your health care provider. Make sure you discuss any questions  you have with your health care provider. Document Released: 09/07/2000 Document Revised: 09/02/2018 Document Reviewed: 09/02/2018 Elsevier Patient Education  2020 [employer]

## 2019-04-07 NOTE — Progress Notes (Addendum)
04/07/19 2:50 PM   Tom Pace 1951-02-19 [id number]  Referring provider: Jodi Marble, MD [place],  [place] [id number]  CC: Gross hematuria, hydronephrosis  HPI: I saw Tom Pace in urology clinic in consultation from Dr. Elijio Miles for bilateral hydronephrosis and gross hematuria.  He is a 69 year old male who is a poor historian who reportedly had bilateral hydronephrosis on a recent renal ultrasound.  He denies any flank pain.  I am unable to personally review the renal ultrasound, as it is in an outside system.  Interestingly, he had a CT abdomen pelvis with contrast in March 2020 for work-up of abdominal pain(ultimately diagnosed with pancreatitis), which showed no hydronephrosis.  There was a 1 cm right upper pole indeterminate renal mass, and a left simple renal cyst.  An MRI was ordered for follow-up, however he did not follow through this secondary to cost.  He was also diagnosed with a staph UTI at that time.  His renal function is normal, with recent creatinine of 0.86, EGFR greater than 60.  More worrisome, he reports intermittent gross hematuria for over a year.  His most recent episode of gross hematuria was 2 to 3 weeks ago.  This can happen at the beginning and the end of his urinary stream.  He has a long history of urinary symptoms of intermittency, and weak stream, and he has been on fish oil/beta cytosterol over-the-counter which he feels improves his urinary symptoms.  He also reports 10 pounds of weight loss in the last 6 months.  He has an extensive history of tobacco use, is an active smoker, and has a 90-pack-year smoking history.  He denies any other carcinogenic exposures.  He has central abdominal pain secondary to his pancreatitis, however he denies any flank pain.  There are no aggravating or alleviating factors.  Severity is moderate to severe.  PVR in clinic today 83m.   PMH: Past Medical History:  Diagnosis Date  . Neuropathy      Surgical History: Past Surgical History:  Procedure Laterality Date  . NO PAST SURGERIES      Allergies: No Known Allergies  Family History: Family History  Problem Relation Age of Onset  . Diabetes Mother   . Osteoporosis Mother   . Suicidality Father     Social History:  reports that he has been smoking. He has never used smokeless tobacco. He reports previous alcohol use. He reports previous drug use.  ROS: Please see flowsheet from today's date for complete review of systems.  Physical Exam: BP (!) 151/92 (BP Location: Left Arm, Patient Position: Sitting)   Pulse 85   Ht '5\' 6"'$  (1.676 m)   Wt 150 lb (68 kg)   BMI 24.21 kg/m    Constitutional: Disheveled, tangential thought process Cardiovascular: No clubbing, cyanosis, or edema. Respiratory: Normal respiratory effort, no increased work of breathing. GI: Abdomen is soft, nontender, nondistended, no abdominal masses GU: No CVA tenderness Skin: No rashes, bruises or suspicious lesions.  Laboratory Data: Reviewed Urinalysis today with no bacteria, 6-10 RBCs, 11-20 WBCs, nitrite negative  Pertinent Imaging: I have personally reviewed the CT abdomen pelvis from March 2020, see HPI for details.  Prostate measures 64 g with multiple stones.  Assessment & Plan:   In summary, the patient is a 68year old male with recent bouts of severe pancreatitis and multiple ER visits, with no hydronephrosis seen on CT scan in March 2020, but reportedly bilateral hydronephrosis seen on a renal ultrasound recently.  I  am unable to review this imaging personally to confirm these findings.  More concerning, he has had over a year of intermittent gross hematuria with a greater than 90-pack-year smoking history worrisome for urothelial malignancy. We discussed common possible etiologies of hematuria including BPH, malignancy, urolithiasis, medical renal disease, and idiopathic. Standard workup recommended by the AUA includes imaging with CT  urogram to assess the upper tracts, and cystoscopy. Cytology is performed on patient's with gross hematuria to look for malignant cells in the urine.  We also discussed that the CT would evaluate hydronephrosis or possible obstructing ureteral lesions.  -Follow-up for CT urogram and cystoscopy -Patient was provided [place] care paperwork, as he is concerned that financially he will not be able to follow-up.  I stressed at length to him that I have a high concern for possible urothelial malignancy, and refusing follow-up with CT urogram and cystoscopy could lead to worsening hematuria, clot retention, metastatic disease, and death  A total of 60 minutes were spent face-to-face with the patient, greater than 50% was spent in patient education, counseling, and coordination of care regarding hematuria, hydronephrosis, CT findings, and BPH symptoms.   Billey Co, [facility] [address], [place], [place] [address] [phone]

## 2019-04-17 ENCOUNTER — Telehealth: Payer: Self-pay | Admitting: Urology

## 2019-04-17 NOTE — Telephone Encounter (Signed)
Returned call to patient. Appt set up for Thursday 04/23/19 with Dr Diamantina Providence to discuss patient concerns.

## 2019-04-17 NOTE — Telephone Encounter (Signed)
Pt cancelled his Cysto and states that he does not want another appt at this time, but would  like to discuss with Dr. He states he can not do Virtual Visit. Please advise

## 2019-04-23 ENCOUNTER — Ambulatory Visit: Payer: Medicare Other | Admitting: Urology

## 2019-05-05 ENCOUNTER — Ambulatory Visit: Payer: Medicare Other | Admitting: Urology

## 2019-06-25 ENCOUNTER — Other Ambulatory Visit: Payer: Self-pay | Admitting: Neurology

## 2019-06-25 DIAGNOSIS — M542 Cervicalgia: Secondary | ICD-10-CM

## 2019-06-28 DIAGNOSIS — M6259 Muscle wasting and atrophy, not elsewhere classified, multiple sites: Secondary | ICD-10-CM | POA: Insufficient documentation

## 2019-06-28 DIAGNOSIS — G629 Polyneuropathy, unspecified: Secondary | ICD-10-CM | POA: Insufficient documentation

## 2019-06-28 DIAGNOSIS — M542 Cervicalgia: Secondary | ICD-10-CM | POA: Insufficient documentation

## 2019-07-05 ENCOUNTER — Other Ambulatory Visit: Payer: Self-pay

## 2019-07-05 ENCOUNTER — Ambulatory Visit
Admission: RE | Admit: 2019-07-05 | Discharge: 2019-07-05 | Disposition: A | Discharge status: Home / Self Care | Payer: Medicare Other | Source: Ambulatory Visit | Attending: Neurology | Admitting: Neurology

## 2019-07-05 DIAGNOSIS — M542 Cervicalgia: Secondary | ICD-10-CM

## 2019-07-22 ENCOUNTER — Ambulatory Visit: Payer: Medicare Other

## 2019-07-22 ENCOUNTER — Other Ambulatory Visit: Payer: Self-pay

## 2019-07-23 ENCOUNTER — Encounter (INDEPENDENT_AMBULATORY_CARE_PROVIDER_SITE_OTHER): Payer: Self-pay | Admitting: Vascular Surgery

## 2019-07-23 ENCOUNTER — Ambulatory Visit (INDEPENDENT_AMBULATORY_CARE_PROVIDER_SITE_OTHER): Payer: Medicare Other | Admitting: Vascular Surgery

## 2019-07-23 DIAGNOSIS — I872 Venous insufficiency (chronic) (peripheral): Secondary | ICD-10-CM

## 2019-07-23 DIAGNOSIS — M4726 Other spondylosis with radiculopathy, lumbar region: Secondary | ICD-10-CM | POA: Diagnosis not present

## 2019-07-23 DIAGNOSIS — I70213 Atherosclerosis of native arteries of extremities with intermittent claudication, bilateral legs: Secondary | ICD-10-CM

## 2019-07-23 DIAGNOSIS — M79605 Pain in left leg: Secondary | ICD-10-CM

## 2019-07-23 DIAGNOSIS — M79604 Pain in right leg: Secondary | ICD-10-CM

## 2019-07-23 DIAGNOSIS — I10 Essential (primary) hypertension: Secondary | ICD-10-CM

## 2019-07-23 DIAGNOSIS — M4722 Other spondylosis with radiculopathy, cervical region: Secondary | ICD-10-CM

## 2019-07-23 DIAGNOSIS — K219 Gastro-esophageal reflux disease without esophagitis: Secondary | ICD-10-CM

## 2019-07-23 NOTE — Progress Notes (Signed)
MRN : [id number]  Tom Pace is a 68 y.o. (03-30-1951) male who presents with chief complaint of  Chief Complaint  Patient presents with   New Patient (Initial Visit)    right leg pain  .  History of Present Illness:   The patient is seen for evaluation of painful lower extremities. Patient notes the pain is variable and not always associated with activity.  The pain is somewhat consistent day to day occurring on most days. The patient notes the pain also occurs with standing and routinely seems worse as the day wears on. The pain has been progressive over the past several years. The patient states these symptoms are causing  a profound negative impact on quality of life and daily activities.  The patient denies rest pain or dangling of an extremity off the side of the bed during the night for relief. No open wounds or sores at this time. No history of DVT or phlebitis. No prior interventions or surgeries.  There is an extensive  history of back and neck problems and DJD of the lumbar and cervical spine is well documented.    Current Meds  Medication Sig   CREON 24000-76000 units CPEP TK 2 CS PO TID WC   famotidine (PEPCID) 20 MG tablet Take 1 tablet (20 mg total) by mouth at bedtime.   lisinopril (ZESTRIL) 10 MG tablet TK 1 T PO QAM   oxymetazoline (AFRIN) 0.05 % nasal spray Place 1 spray into both nostrils 2 (two) times daily.   pantoprazole (PROTONIX) 40 MG tablet    rOPINIRole (REQUIP) 0.25 MG tablet     Past Medical History:  Diagnosis Date   Hypertension    Neuropathy     Past Surgical History:  Procedure Laterality Date   NO PAST SURGERIES      Social History Social History   Tobacco Use   Smoking status: Current Every Day Smoker   Smokeless tobacco: Never Used  Substance Use Topics   Alcohol use: Not Currently   Drug use: Not Currently    Family History Family History  Problem Relation Age of Onset   Diabetes Mother     Osteoporosis Mother    Suicidality Father   No family history of bleeding/clotting disorders, porphyria or autoimmune disease   Allergies  Allergen Reactions   Pregabalin Other (See Comments)    "Violent seizures"     REVIEW OF SYSTEMS (Negative unless checked)  Constitutional: [] Weight loss  [] Fever  [] Chills Cardiac: [] Chest pain   [] Chest pressure   [] Palpitations   [] Shortness of breath when laying flat   [] Shortness of breath with exertion. Vascular:  [x] Pain in legs with walking   [x] Pain in legs at rest  [] History of DVT   [] Phlebitis   [x] Swelling in legs   [] Varicose veins   [] Non-healing ulcers Pulmonary:   [] Uses home oxygen   [] Productive cough   [] Hemoptysis   [] Wheeze  [] COPD   [] Asthma Neurologic:  [] Dizziness   [] Seizures   [] History of stroke   [] History of TIA  [] Aphasia   [] Vissual changes   [] Weakness or numbness in arm   [] Weakness or numbness in leg Musculoskeletal:   [x] Joint swelling   [x] Joint pain   [x] Low back pain Hematologic:  [] Easy bruising  [] Easy bleeding   [] Hypercoagulable state   [] Anemic Gastrointestinal:  [] Diarrhea   [] Vomiting  [x] Gastroesophageal reflux/heartburn   [] Difficulty swallowing. Genitourinary:  [] Chronic kidney disease   [] Difficult urination  [] Frequent urination   [] Blood  in urine Skin:  [] Rashes   [] Ulcers  Psychological:  [] History of anxiety   []  History of major depression.  Physical Examination  Vitals:   07/23/19 1349  BP: (!) 174/82  Pulse: 66  Resp: 16  Weight: 138 lb (62.6 kg)  Height: 5\' 6"  (1.676 m)   Body mass index is 22.27 kg/m. Gen: WD/WN, NAD Head: [place]/AT, No temporalis wasting.  Ear/Nose/Throat: Hearing grossly intact, nares w/o erythema or drainage, poor dentition Eyes: PER, EOMI, sclera nonicteric.  Neck: Supple, no masses.  No bruit or JVD.  Pulmonary:  Good air movement, clear to auscultation bilaterally, no use of accessory muscles.  Cardiac: RRR, normal S1, S2, no Murmurs. Vascular: scattered  varicosities present bilaterally.  severe venous stasis changes to the legs bilaterally.  2-3+ soft pitting edema Vessel Right Left  Radial Palpable Palpable  PT Not Palpable Not Palpable  DP Not Palpable Not Palpable   Gastrointestinal: soft, non-distended. No guarding/no peritoneal signs.  Musculoskeletal: M/S 5/5 throughout.  No deformity or atrophy.  Neurologic: CN 2-12 intact. Pain and light touch intact in extremities.  Symmetrical.  Speech is fluent. Motor exam as listed above. Psychiatric: Judgment intact, Mood & affect appropriate for pt's clinical situation. Dermatologic: severe venous rashes but no ulcers noted.  No changes consistent with cellulitis. Lymph : No Cervical lymphadenopathy, no lichenification or skin changes of chronic lymphedema.  CBC Lab Results  Component Value Date   WBC 15.1 (H) 12/11/2018   HGB 14.9 12/11/2018   HCT 44.7 12/11/2018   MCV 71.1 (L) 12/11/2018   PLT 258 12/11/2018    BMET    Component Value Date/Time   NA 140 12/11/2018 1137   K 3.6 12/11/2018 1137   CL 107 12/11/2018 1137   CO2 23 12/11/2018 1137   GLUCOSE 98 12/11/2018 1137   BUN 18 12/11/2018 1137   CREATININE 0.72 12/11/2018 1137   CALCIUM 9.5 12/11/2018 1137   GFRNONAA >60 12/11/2018 1137   GFRAA >60 12/11/2018 1137   CrCl cannot be calculated (Patient's most recent lab result is older than the maximum 21 days allowed.).  COAG No results found for: INR, PROTIME  Radiology Mr Cervical Spine Wo Contrast  Result Date: 07/06/2019 CLINICAL DATA:  68 year old male with chronic neck pain. Loss of strength in both arms greater on the left. EXAM: MRI CERVICAL SPINE WITHOUT CONTRAST TECHNIQUE: Multiplanar, multisequence MR imaging of the cervical spine was performed. No intravenous contrast was administered. COMPARISON:  None. FINDINGS: Alignment: Straightening of cervical lordosis. No significant spondylolisthesis. Vertebrae: Confluent marrow edema in the chronically degenerated  left C2-C3 facets (series 7, image 14). Associated small left facet joint effusion. Chronic degenerative endplate marrow signal changes. Background bone marrow signal is within normal limits. No other convincing marrow edema or evidence of acute osseous abnormality. Cord: No spinal cord signal abnormality identified despite spinal stenosis with spinal cord mass effect as detailed below. Posterior Fossa, vertebral arteries, paraspinal tissues: Cervicomedullary junction is within normal limits. Negative visible brain parenchyma. Preserved major vascular flow voids in the neck, the right vertebral artery appears dominant. Negative neck soft tissues, aside from mild inflammation immediately adjacent to the left C2-C3 facet (series 9, image 9). Negative visible lung apices. Disc levels: C2-C3: Moderate to severe left facet hypertrophy with edema as stated above. Disc bulge and endplate spurring. Mild right facet hypertrophy. No spinal stenosis. Mild to moderate left C3 foraminal stenosis. C3-C4: Left eccentric disc bulge and endplate spurring plus moderate left facet hypertrophy. Ligament flavum hypertrophy.  Borderline to mild spinal stenosis, no cord mass effect. Severe left and mild right C4 foraminal stenosis. C4-C5: Disc space loss with circumferential disc osteophyte complex. Bulky and broad-based left paracentral component (series 8, image 16). Mild facet and ligament flavum hypertrophy. Mild spinal stenosis and spinal cord mass effect. Severe bilateral C5 foraminal stenosis. C5-C6: Disc space loss with circumferential disc osteophyte complex eccentric to the right. No significant spinal stenosis. Moderate to severe bilateral C6 foraminal stenosis. C6-C7: Disc space loss with circumferential disc osteophyte complex. No significant spinal stenosis. Mild left and mild-to-moderate right C7 foraminal stenosis. C7-T1: Disc space loss with circumferential disc osteophyte complex. Mild posterior element hypertrophy.  Severe bilateral C8 foraminal stenosis. Upper thoracic spine degeneration but no significant upper thoracic spinal stenosis. There is intermittent upper thoracic foraminal stenosis, moderate to severe at the bilateral T1 nerve levels. IMPRESSION: 1. Acute exacerbation of chronic facet joint arthritis on the left at C2-C3 with confluent marrow edema. Associated moderate degenerative left C3 foraminal stenosis. 2. Widespread cervical spine disc and endplate degeneration. Multifactorial spinal stenosis with mild spinal cord mass effect at C4-C5, but no spinal cord signal abnormality. 3. Multifactorial moderate or severe neural foraminal stenosis also at the left C4, bilateral C5, bilateral C6,, right C7, bilateral C8, and bilateral T1 nerve levels. Electronically Signed   By: Odessa Fleming M.D.   On: 07/06/2019 00:53     Assessment/Plan 1. Pain in both lower extremities  Recommend:  The patient has atypical pain symptoms for pure atherosclerotic disease. However, on physical exam there is evidence of mixed venous and arterial disease, given the diminished pulses and the edema associated with venous changes of the legs.  Noninvasive studies including ABI's and venous ultrasound of the legs will be obtained and the patient will follow up with me to review these studies.  I suspect the patient is c/o pseudoclaudication.  Patient should have an evaluation of his LS spine which I defer to the primary service.  The patient should continue walking and begin a more formal exercise program. The patient should continue his antiplatelet therapy and aggressive treatment of the lipid abnormalities.  The patient should begin wearing graduated compression socks 15-20 mmHg strength to control edema.   - VAS [place] ABI WITH/WO TBI; Future - VAS [place] LOWER EXTREMITY VENOUS (DVT); Future  - Ambulatory referral to Neurosurgery  2. Atherosclerosis of native artery of both lower extremities with intermittent claudication (HCC)   Recommend:  The patient has atypical pain symptoms for pure atherosclerotic disease. However, on physical exam there is evidence of mixed venous and arterial disease, given the diminished pulses and the edema associated with venous changes of the legs.  Noninvasive studies including ABI's and venous ultrasound of the legs will be obtained and the patient will follow up with me to review these studies.  I suspect the patient is c/o pseudoclaudication.  Patient should have an evaluation of his LS spine which I defer to the primary service.  The patient should continue walking and begin a more formal exercise program. The patient should continue his antiplatelet therapy and aggressive treatment of the lipid abnormalities.  The patient should begin wearing graduated compression socks 15-20 mmHg strength to control edema.  - VAS [place] ABI WITH/WO TBI; Future  3. Chronic venous insufficiency No surgery or intervention at this point in time.    I have had a long discussion with the patient regarding venous insufficiency and why it  causes symptoms. I have discussed with the patient  the chronic skin changes that accompany venous insufficiency and the long term sequela such as infection and ulceration.  Patient will begin wearing graduated compression stockings class 1 (20-30 mmHg) or compression wraps on a daily basis a prescription was given. The patient will put the stockings on first thing in the morning and removing them in the evening. The patient is instructed specifically not to sleep in the stockings.    In addition, behavioral modification including several periods of elevation of the lower extremities during the day will be continued. I have demonstrated that proper elevation is a position with the ankles at heart level.  The patient is instructed to begin routine exercise, especially walking on a daily basis  Patient should undergo duplex ultrasound of the venous system to ensure that DVT or  reflux is not present.  Following the review of the ultrasound the patient will follow up in 2-3 months to reassess the degree of swelling and the control that graduated compression stockings or compression wraps  is offering.   The patient can be assessed for a Lymph Pump at that time  - VAS [place] LOWER EXTREMITY VENOUS (DVT); Future  4. Osteoarthritis of spine with radiculopathy, lumbar region Continue NSAID medications as already ordered, these medications have been reviewed and there are no changes at this time.  Continued activity and therapy was stressed.  - Ambulatory referral to Neurosurgery  5. Osteoarthritis of spine with radiculopathy, cervical region Continue NSAID medications as already ordered, these medications have been reviewed and there are no changes at this time.  Continued activity and therapy was stressed.  - Ambulatory referral to Neurosurgery  6. Essential hypertension Continue antihypertensive medications as already ordered, these medications have been reviewed and there are no changes at this time.   7. Gastroesophageal reflux disease without esophagitis Continue PPI as already ordered, this medication has been reviewed and there are no changes at this time.  Avoidence of caffeine and alcohol  Moderate elevation of the head of the bed    Levora DredgeGregory Miasia Crabtree, MD  07/23/2019 5:54 PM

## 2019-07-24 ENCOUNTER — Telehealth (INDEPENDENT_AMBULATORY_CARE_PROVIDER_SITE_OTHER): Payer: Self-pay

## 2019-07-24 NOTE — Telephone Encounter (Signed)
Patient left a message requesting a prescription for Hydroquinone 4% skin cream to help with the spots on his legs and feet. I informed the patient that I will speak with Dr Delana Meyer when he comes in office.

## 2019-07-26 DIAGNOSIS — I10 Essential (primary) hypertension: Secondary | ICD-10-CM | POA: Insufficient documentation

## 2019-07-26 DIAGNOSIS — M47812 Spondylosis without myelopathy or radiculopathy, cervical region: Secondary | ICD-10-CM | POA: Insufficient documentation

## 2019-07-26 DIAGNOSIS — M47816 Spondylosis without myelopathy or radiculopathy, lumbar region: Secondary | ICD-10-CM | POA: Insufficient documentation

## 2019-07-26 DIAGNOSIS — I872 Venous insufficiency (chronic) (peripheral): Secondary | ICD-10-CM | POA: Insufficient documentation

## 2019-07-26 DIAGNOSIS — I70219 Atherosclerosis of native arteries of extremities with intermittent claudication, unspecified extremity: Secondary | ICD-10-CM | POA: Insufficient documentation

## 2019-07-26 DIAGNOSIS — K219 Gastro-esophageal reflux disease without esophagitis: Secondary | ICD-10-CM | POA: Insufficient documentation

## 2019-07-26 DIAGNOSIS — M79606 Pain in leg, unspecified: Secondary | ICD-10-CM | POA: Insufficient documentation

## 2019-07-27 NOTE — Telephone Encounter (Signed)
I spoke with Dr Delana Meyer and he recommened for the patient to contact PCP since its more with dermatology. Patient has be informed with medical advice and verbalized understanding.

## 2019-08-06 ENCOUNTER — Other Ambulatory Visit: Payer: Self-pay | Admitting: Student

## 2019-08-06 DIAGNOSIS — M5442 Lumbago with sciatica, left side: Secondary | ICD-10-CM

## 2019-08-06 DIAGNOSIS — M5441 Lumbago with sciatica, right side: Secondary | ICD-10-CM

## 2019-08-10 ENCOUNTER — Other Ambulatory Visit: Payer: Self-pay

## 2019-08-10 ENCOUNTER — Ambulatory Visit (INDEPENDENT_AMBULATORY_CARE_PROVIDER_SITE_OTHER): Payer: Medicare Other | Admitting: Vascular Surgery

## 2019-08-10 ENCOUNTER — Ambulatory Visit (INDEPENDENT_AMBULATORY_CARE_PROVIDER_SITE_OTHER): Payer: Medicare Other

## 2019-08-10 ENCOUNTER — Encounter (INDEPENDENT_AMBULATORY_CARE_PROVIDER_SITE_OTHER): Payer: Self-pay | Admitting: Vascular Surgery

## 2019-08-10 VITALS — BP 158/80 | HR 64 | Resp 16 | Wt 132.8 lb

## 2019-08-10 DIAGNOSIS — M79605 Pain in left leg: Secondary | ICD-10-CM | POA: Diagnosis not present

## 2019-08-10 DIAGNOSIS — I872 Venous insufficiency (chronic) (peripheral): Secondary | ICD-10-CM

## 2019-08-10 DIAGNOSIS — K219 Gastro-esophageal reflux disease without esophagitis: Secondary | ICD-10-CM

## 2019-08-10 DIAGNOSIS — I10 Essential (primary) hypertension: Secondary | ICD-10-CM

## 2019-08-10 DIAGNOSIS — I70213 Atherosclerosis of native arteries of extremities with intermittent claudication, bilateral legs: Secondary | ICD-10-CM

## 2019-08-10 DIAGNOSIS — M79604 Pain in right leg: Secondary | ICD-10-CM

## 2019-08-10 DIAGNOSIS — M4726 Other spondylosis with radiculopathy, lumbar region: Secondary | ICD-10-CM

## 2019-08-10 NOTE — Progress Notes (Signed)
MRN : [id number]  Tom Pace is a 68 y.o. (1950-11-04) male who presents with chief complaint of  Chief Complaint  Patient presents with   Follow-up    ultrasound follow up  .  History of Present Illness:   The patient returns to the office for followup and review of the noninvasive studies. There have been no interval changes in lower extremity symptoms. No interval shortening of the patient's claudication distance or development of rest pain symptoms. No new ulcers or wounds have occurred since the last visit.  There have been no significant changes to the patient's overall health care.  The patient denies amaurosis fugax or recent TIA symptoms. There are no recent neurological changes noted. The patient denies history of DVT, PE or superficial thrombophlebitis. The patient denies recent episodes of angina or shortness of breath.   ABI Rt=0.52 and Lt=1.04   Duplex ultrasound of the venous system shows patent deep venous system no thrombus no reflux  Current Meds  Medication Sig   CREON 24000-76000 units CPEP TK 2 CS PO TID WC   famotidine (PEPCID) 20 MG tablet Take 1 tablet (20 mg total) by mouth at bedtime.   lisinopril (ZESTRIL) 10 MG tablet TK 1 T PO QAM   oxymetazoline (AFRIN) 0.05 % nasal spray Place 1 spray into both nostrils 2 (two) times daily.   pantoprazole (PROTONIX) 40 MG tablet    rOPINIRole (REQUIP) 0.25 MG tablet     Past Medical History:  Diagnosis Date   Hypertension    Neuropathy     Past Surgical History:  Procedure Laterality Date   NO PAST SURGERIES      Social History Social History   Tobacco Use   Smoking status: Current Every Day Smoker   Smokeless tobacco: Never Used  Substance Use Topics   Alcohol use: Not Currently   Drug use: Not Currently    Family History Family History  Problem Relation Age of Onset   Diabetes Mother    Osteoporosis Mother    Suicidality Father     Allergies  Allergen Reactions     Pregabalin Other (See Comments)    "Violent seizures"     REVIEW OF SYSTEMS (Negative unless checked)  Constitutional: Weight loss  Fever  Chills Cardiac: Chest pain   Chest pressure   Palpitations   Shortness of breath when laying flat   Shortness of breath with exertion. Vascular:  Pain in legs with walking   Pain in legs at rest  History of DVT   Phlebitis   Swelling in legs   Varicose veins   Non-healing ulcers Pulmonary:   Uses home oxygen   Productive cough   Hemoptysis   Wheeze  COPD   Asthma Neurologic:  Dizziness   Seizures   History of stroke   History of TIA  Aphasia   Vissual changes   Weakness or numbness in arm   Weakness or numbness in leg Musculoskeletal:   Joint swelling   Joint pain   Low back pain Hematologic:  Easy bruising  Easy bleeding   Hypercoagulable state   Anemic Gastrointestinal:  Diarrhea   Vomiting  Gastroesophageal reflux/heartburn   Difficulty swallowing. Genitourinary:  Chronic kidney disease   Difficult urination  Frequent urination   Blood in urine Skin:  Rashes   Ulcers  Psychological:  History of anxiety    History of major depression.  Physical Examination  Vitals:   08/10/19 1452  BP: (!) 158/80  Pulse: 64  Resp: 16  Weight: 132 lb 12.8 oz (60.2 kg)   Body mass index is 21.43 kg/m. Gen: WD/WN, NAD Head: [place]/AT, No temporalis wasting.  Ear/Nose/Throat: Hearing grossly intact, nares w/o erythema or drainage Eyes: PER, EOMI, sclera nonicteric.  Neck: Supple, no large masses.   Pulmonary:  Good air movement, no audible wheezing bilaterally, no use of accessory muscles.  Cardiac: RRR, no JVD Vascular: scattered varicosities present bilaterally.  Mild venous stasis changes to the legs bilaterally.  2+ soft pitting edema Vessel Right Left  Radial Palpable Palpable  PT Not Palpable Not Palpable  DP Not Palpable Palpable  Gastrointestinal:  Non-distended. No guarding/no peritoneal signs.  Musculoskeletal: M/S 5/5 throughout.  No deformity or atrophy.  Neurologic: CN 2-12 intact. Symmetrical.  Speech is fluent. Motor exam as listed above. Psychiatric: Judgment intact, Mood & affect appropriate for pt's clinical situation. Dermatologic: No rashes or ulcers noted.  No changes consistent with cellulitis. Lymph : No lichenification or skin changes of chronic lymphedema.  CBC Lab Results  Component Value Date   WBC 15.1 (H) 12/11/2018   HGB 14.9 12/11/2018   HCT 44.7 12/11/2018   MCV 71.1 (L) 12/11/2018   PLT 258 12/11/2018    BMET    Component Value Date/Time   NA 140 12/11/2018 1137   K 3.6 12/11/2018 1137   CL 107 12/11/2018 1137   CO2 23 12/11/2018 1137   GLUCOSE 98 12/11/2018 1137   BUN 18 12/11/2018 1137   CREATININE 0.72 12/11/2018 1137   CALCIUM 9.5 12/11/2018 1137   GFRNONAA >60 12/11/2018 1137   GFRAA >60 12/11/2018 1137   CrCl cannot be calculated (Patient's most recent lab result is older than the maximum 21 days allowed.).  COAG No results found for: INR, PROTIME  Radiology Vas [place] Vanice Sarahbi With/wo Tbi  Result Date: 08/10/2019 LOWER EXTREMITY DOPPLER STUDY Indications: Rest pain.  Performing Technologist: Reece AgarValarie Baldwin RT (R)(VS)  Examination Guidelines: A complete evaluation includes at minimum, Doppler waveform signals and systolic blood pressure reading at the level of bilateral brachial, anterior tibial, and posterior tibial arteries, when vessel segments are accessible. Bilateral testing is considered an integral part of a complete examination. Photoelectric Plethysmograph (PPG) waveforms and toe systolic pressure readings are included as required and additional duplex testing as needed. Limited examinations for reoccurring indications may be performed as noted.  ABI Findings: +---------+------------------+-----+----------+--------+  Right     Rt Pressure (mmHg) Index Waveform   Comment    +---------+------------------+-----+----------+--------+  Brachial  163                                           +---------+------------------+-----+----------+--------+  ATA       77                 0.47  monophasic           +---------+------------------+-----+----------+--------+  PTA       84                 0.52  monophasic           +---------+------------------+-----+----------+--------+  Great Toe 46                 0.28  Abnormal             +---------+------------------+-----+----------+--------+ +---------+------------------+-----+---------+-------+  Left      Lt Pressure (mmHg) Index Waveform  Comment  +---------+------------------+-----+---------+-------+  Brachial  156                                         +---------+------------------+-----+---------+-------+  ATA       170                1.04  triphasic          +---------+------------------+-----+---------+-------+  PTA       67                 0.41  biphasic           +---------+------------------+-----+---------+-------+  Great Toe 137                0.84  Normal             +---------+------------------+-----+---------+-------+  Summary: Right: Resting right ankle-brachial index indicates moderate right lower extremity arterial disease. The right toe-brachial index is abnormal. Left: Resting left ankle-brachial index is within normal range. No evidence of significant left lower extremity arterial disease. The left toe-brachial index is normal.  *See table(s) above for measurements and observations.  Electronically signed by Hortencia Pilar MD on 08/10/2019 at 4:54:24 PM.   Final    Vas [place] Lower Extremity Venous Reflux  Result Date: 08/10/2019  Lower Venous Reflux Study Indications: Swelling, and Pain.  Performing Technologist: Charlane Ferretti RT (R)(VS)  Examination Guidelines: A complete evaluation includes B-mode imaging, spectral Doppler, color Doppler, and power Doppler as needed of all accessible portions of each vessel. Bilateral  testing is considered an integral part of a complete examination. Limited examinations for reoccurring indications may be performed as noted. The reflux portion of the exam is performed with the patient in reverse Trendelenburg.  +---------+---------------+---------+-----------+----------+--------------+  RIGHT     Compressibility Phasicity Spontaneity Properties Thrombus Aging  +---------+---------------+---------+-----------+----------+--------------+  CFV       Full                                                             +---------+---------------+---------+-----------+----------+--------------+  SFJ       Full                                                             +---------+---------------+---------+-----------+----------+--------------+  FV Prox   Full                                                             +---------+---------------+---------+-----------+----------+--------------+  FV Mid    Full                                                             +---------+---------------+---------+-----------+----------+--------------+  FV Distal Full                                                             +---------+---------------+---------+-----------+----------+--------------+  POP       Full                                                             +---------+---------------+---------+-----------+----------+--------------+  GSV       Full                                                             +---------+---------------+---------+-----------+----------+--------------+  SSV       Full                                                             +---------+---------------+---------+-----------+----------+--------------+  +---------+---------------+---------+-----------+----------+--------------+  LEFT      Compressibility Phasicity Spontaneity Properties Thrombus Aging  +---------+---------------+---------+-----------+----------+--------------+  CFV       Full                                                              +---------+---------------+---------+-----------+----------+--------------+  SFJ       Full                                                             +---------+---------------+---------+-----------+----------+--------------+  FV Prox   Full                                                             +---------+---------------+---------+-----------+----------+--------------+  FV Mid    Full                                                             +---------+---------------+---------+-----------+----------+--------------+  FV Distal Full                                                             +---------+---------------+---------+-----------+----------+--------------+  POP       Full                                                             +---------+---------------+---------+-----------+----------+--------------+  GSV       Full                                                             +---------+---------------+---------+-----------+----------+--------------+  SSV       Full                                                             +---------+---------------+---------+-----------+----------+--------------+ Venous Reflux Times Normal value < 0.5 sec +--------------+------+---------+--------+--------+  RIGHT          Reflux Reflux No Diameter Comments                   Yes                                +--------------+------+---------+--------+--------+  CFV             yes                         +--------------+------+---------+--------+--------+  FV mid                   no                        +--------------+------+---------+--------+--------+  Popliteal                no                        +--------------+------+---------+--------+--------+  GSV at SFJ      yes                         +--------------+------+---------+--------+--------+  GSV prox thigh           no                        +--------------+------+---------+--------+--------+  GSV mid  thigh            no                        +--------------+------+---------+--------+--------+  GSV dist thigh           no                        +--------------+------+---------+--------+--------+  GSV at knee              no                        +--------------+------+---------+--------+--------+  +--------------+------+---------+--------+--------+  LEFT           Reflux Reflux No Diameter Comments                   Yes                                +--------------+------+---------+--------+--------+  CFV             yes                         +--------------+------+---------+--------+--------+  FV mid                   no                        +--------------+------+---------+--------+--------+  Popliteal       yes                         +--------------+------+---------+--------+--------+  GSV at SFJ               no                        +--------------+------+---------+--------+--------+  GSV prox thigh           no                        +--------------+------+---------+--------+--------+  GSV mid thigh            no                        +--------------+------+---------+--------+--------+  GSV dist thigh           no                        +--------------+------+---------+--------+--------+  GSV at knee              no                        +--------------+------+---------+--------+--------+  Summary: Right: There is no evidence of deep vein thrombosis in the lower extremity.There is no evidence of superficial venous thrombosis. Left: There is no evidence of deep vein thrombosis in the lower extremity.There is no evidence of superficial venous thrombosis.  *See table(s) above for measurements and observations. Electronically signed by Levora Dredge MD on 08/10/2019 at 4:54:26 PM.    Final     Assessment/Plan 1. Atherosclerosis of native artery of both lower extremities with intermittent claudication (HCC)  Recommend:  The patient has evidence of atherosclerosis of the lower  extremities with claudication.  The patient does not voice lifestyle limiting changes at this point in time.  Noninvasive studies do not suggest clinically significant change.  No invasive studies, angiography or surgery at this time The patient should continue walking and begin a more formal exercise program.  The patient should continue antiplatelet therapy and aggressive treatment of the lipid abnormalities  No changes in the patient's medications at this time  The patient should continue wearing graduated compression socks 10-15 mmHg strength to control the mild edema.   - ABI; Future  2. Chronic venous insufficiency No surgery or intervention at this point in time.    I  have had a long discussion with the patient regarding venous insufficiency and why it  causes symptoms. I have discussed with the patient the chronic skin changes that accompany venous insufficiency and the long term sequela such as infection and ulceration.  Patient will begin wearing graduated compression stockings class 1 (20-30 mmHg) or compression wraps on a daily basis a prescription was given. The patient will put the stockings on first thing in the morning and removing them in the evening. The patient is instructed specifically not to sleep in the stockings.    In addition, behavioral modification including several periods of elevation of the lower extremities during the day will be continued. I have demonstrated that proper elevation is a position with the ankles at heart level.  The patient is instructed to begin routine exercise, especially walking on a daily basis  Following the review of the ultrasound the patient will follow up in 2-3 months to reassess the degree of swelling and the control that graduated compression stockings or compression wraps  is offering.   The patient can be assessed for a Lymph Pump at that time  3. Essential hypertension Continue antihypertensive medications as already ordered,  these medications have been reviewed and there are no changes at this time.   4. Gastroesophageal reflux disease without esophagitis Continue PPI as already ordered, this medication has been reviewed and there are no changes at this time.  Avoidence of caffeine and alcohol  Moderate elevation of the head of the bed   5. Osteoarthritis of spine with radiculopathy, lumbar region Continue NSAID medications as already ordered, these medications have been reviewed and there are no changes at this time.  Continued activity and therapy was stressed.     Levora Dredge, MD  08/10/2019 8:49 PM

## 2019-08-17 ENCOUNTER — Other Ambulatory Visit: Payer: Self-pay | Admitting: Gastroenterology

## 2019-08-17 DIAGNOSIS — K861 Other chronic pancreatitis: Secondary | ICD-10-CM

## 2019-08-19 ENCOUNTER — Ambulatory Visit
Admission: RE | Admit: 2019-08-19 | Discharge: 2019-08-19 | Disposition: A | Discharge status: Home / Self Care | Payer: Medicare Other | Source: Ambulatory Visit | Attending: Student | Admitting: Student

## 2019-08-19 ENCOUNTER — Other Ambulatory Visit: Payer: Self-pay

## 2019-08-19 DIAGNOSIS — M5441 Lumbago with sciatica, right side: Secondary | ICD-10-CM | POA: Diagnosis present

## 2019-08-19 DIAGNOSIS — M5442 Lumbago with sciatica, left side: Secondary | ICD-10-CM | POA: Diagnosis not present

## 2019-09-06 ENCOUNTER — Other Ambulatory Visit: Payer: Medicare Other

## 2019-09-10 ENCOUNTER — Other Ambulatory Visit: Payer: Self-pay | Admitting: Gastroenterology

## 2019-09-10 DIAGNOSIS — N289 Disorder of kidney and ureter, unspecified: Secondary | ICD-10-CM

## 2019-09-10 DIAGNOSIS — K861 Other chronic pancreatitis: Secondary | ICD-10-CM

## 2019-09-10 DIAGNOSIS — K862 Cyst of pancreas: Secondary | ICD-10-CM

## 2019-09-23 ENCOUNTER — Ambulatory Visit
Admission: RE | Admit: 2019-09-23 | Discharge: 2019-09-23 | Disposition: A | Discharge status: Home / Self Care | Payer: Medicare Other | Source: Ambulatory Visit | Attending: Gastroenterology | Admitting: Gastroenterology

## 2019-09-23 ENCOUNTER — Other Ambulatory Visit: Payer: Self-pay

## 2019-09-23 DIAGNOSIS — K861 Other chronic pancreatitis: Secondary | ICD-10-CM | POA: Diagnosis not present

## 2019-09-23 DIAGNOSIS — N289 Disorder of kidney and ureter, unspecified: Secondary | ICD-10-CM

## 2019-09-23 DIAGNOSIS — K862 Cyst of pancreas: Secondary | ICD-10-CM | POA: Insufficient documentation

## 2019-09-23 DIAGNOSIS — R110 Nausea: Secondary | ICD-10-CM

## 2019-09-23 MED ORDER — GADOBUTROL 1 MMOL/ML IV SOLN
7.0000 mL | Freq: Once | INTRAVENOUS | Status: AC | PRN
  Administered 2019-09-23: 7 mL via INTRAVENOUS

## 2019-12-18 DIAGNOSIS — M4802 Spinal stenosis, cervical region: Secondary | ICD-10-CM | POA: Insufficient documentation

## 2019-12-18 DIAGNOSIS — R937 Abnormal findings on diagnostic imaging of other parts of musculoskeletal system: Secondary | ICD-10-CM | POA: Insufficient documentation

## 2020-02-08 ENCOUNTER — Encounter (INDEPENDENT_AMBULATORY_CARE_PROVIDER_SITE_OTHER): Payer: Medicare Other

## 2020-02-08 ENCOUNTER — Ambulatory Visit (INDEPENDENT_AMBULATORY_CARE_PROVIDER_SITE_OTHER): Payer: Medicare Other | Admitting: Vascular Surgery

## 2020-02-11 ENCOUNTER — Telehealth: Payer: Self-pay | Admitting: *Deleted

## 2020-02-11 ENCOUNTER — Encounter: Payer: Self-pay | Admitting: *Deleted

## 2020-02-11 DIAGNOSIS — Z87891 Personal history of nicotine dependence: Secondary | ICD-10-CM

## 2020-02-11 DIAGNOSIS — Z122 Encounter for screening for malignant neoplasm of respiratory organs: Secondary | ICD-10-CM

## 2020-02-11 NOTE — Telephone Encounter (Signed)
Received referral for initial lung cancer screening scan. Contacted patient and obtained smoking history,(formere, quit 2019, 73.5 pack year) as well as answering questions related to screening process. Patient denies signs of lung cancer such as weight loss or hemoptysis. Patient denies comorbidity that would prevent curative treatment if lung cancer were found. Patient is scheduled for shared decision making visit and CT scan on 02/17/20 at 245pm.

## 2020-02-17 ENCOUNTER — Ambulatory Visit
Admission: RE | Admit: 2020-02-17 | Discharge: 2020-02-17 | Disposition: A | Discharge status: Home / Self Care | Payer: Medicare Other | Source: Ambulatory Visit | Attending: Oncology | Admitting: Oncology

## 2020-02-17 ENCOUNTER — Other Ambulatory Visit: Payer: Self-pay

## 2020-02-17 ENCOUNTER — Inpatient Hospital Stay: Payer: Medicare Other | Attending: Oncology | Admitting: Oncology

## 2020-02-17 DIAGNOSIS — Z87891 Personal history of nicotine dependence: Secondary | ICD-10-CM

## 2020-02-17 DIAGNOSIS — Z122 Encounter for screening for malignant neoplasm of respiratory organs: Secondary | ICD-10-CM | POA: Diagnosis present

## 2020-02-17 NOTE — Progress Notes (Signed)
Virtual Visit via Video Note  I connected with [name] on 02/17/20 at  2:45 PM EDT by a video enabled telemedicine application and verified that I am speaking with the correct person using two identifiers.  Location: Patient: [facility] Provider: Clinic    I discussed the limitations of evaluation and management by telemedicine and the availability of in person appointments. The patient expressed understanding and agreed to proceed.  I discussed the assessment and treatment plan with the patient. The patient was provided an opportunity to ask questions and all were answered. The patient agreed with the plan and demonstrated an understanding of the instructions.   The patient was advised to call back or seek an in-person evaluation if the symptoms worsen or if the condition fails to improve as anticipated.   In accordance with CMS guidelines, patient has met eligibility criteria including age, absence of signs or symptoms of lung cancer.  Social History   Tobacco Use  . Smoking status: Former Smoker    Packs/day: 1.50    Years: 49.00    Pack years: 73.50    Types: Cigarettes    Quit date: 2019    Years since quitting: 2.4  . Smokeless tobacco: Never Used  Substance Use Topics  . Alcohol use: Not Currently  . Drug use: Not Currently      A shared decision-making session was conducted prior to the performance of CT scan. This includes one or more decision aids, includes benefits and harms of screening, follow-up diagnostic testing, over-diagnosis, false positive rate, and total radiation exposure.   Counseling on the importance of adherence to annual lung cancer LDCT screening, impact of co-morbidities, and ability or willingness to undergo diagnosis and treatment is imperative for compliance of the program.   Counseling on the importance of continued smoking cessation for former smokers; the importance of smoking cessation for current smokers, and information about tobacco cessation  interventions have been given to patient including [place] and [employer] quit [place] programs.   Written order for lung cancer screening with LDCT has been given to the patient and any and all questions have been answered to the best of my abilities.    Yearly follow up will be coordinated by Burgess Estelle, Thoracic Navigator.  I provided 15 minutes of face-to-face video visit time during this encounter, and > 50% was spent counseling as documented under my assessment & plan.   Jacquelin Hawking, NP

## 2020-02-19 ENCOUNTER — Encounter: Payer: Self-pay | Admitting: *Deleted

## 2020-03-17 ENCOUNTER — Ambulatory Visit (INDEPENDENT_AMBULATORY_CARE_PROVIDER_SITE_OTHER): Payer: Medicare Other | Admitting: Vascular Surgery

## 2020-03-17 ENCOUNTER — Other Ambulatory Visit: Payer: Self-pay

## 2020-03-17 ENCOUNTER — Encounter (INDEPENDENT_AMBULATORY_CARE_PROVIDER_SITE_OTHER): Payer: Self-pay | Admitting: Vascular Surgery

## 2020-03-17 ENCOUNTER — Ambulatory Visit (INDEPENDENT_AMBULATORY_CARE_PROVIDER_SITE_OTHER): Payer: Medicare Other

## 2020-03-17 VITALS — BP 147/82 | HR 71 | Resp 16 | Ht 66.0 in | Wt 141.0 lb

## 2020-03-17 DIAGNOSIS — I70213 Atherosclerosis of native arteries of extremities with intermittent claudication, bilateral legs: Secondary | ICD-10-CM | POA: Diagnosis not present

## 2020-03-17 DIAGNOSIS — I10 Essential (primary) hypertension: Secondary | ICD-10-CM

## 2020-03-17 DIAGNOSIS — I872 Venous insufficiency (chronic) (peripheral): Secondary | ICD-10-CM | POA: Diagnosis not present

## 2020-03-20 ENCOUNTER — Encounter (INDEPENDENT_AMBULATORY_CARE_PROVIDER_SITE_OTHER): Payer: Self-pay | Admitting: Vascular Surgery

## 2020-03-20 NOTE — Progress Notes (Signed)
MRN : [id number]  Tom Pace is a 69 y.o. (Apr 13, 1951) male who presents with chief complaint of  Chief Complaint  Patient presents with  . Follow-up    ultrasound  .  History of Present Illness:   The patient returns to the office for followup and review of the noninvasive studies. There have been no interval changes in lower extremity symptoms. No interval shortening of the patient's claudication distance or development of rest pain symptoms. No new ulcers or wounds have occurred since the last visit.  There have been no significant changes to the patient's overall health care.  The patient denies amaurosis fugax or recent TIA symptoms. There are no recent neurological changes noted. The patient denies history of DVT, PE or superficial thrombophlebitis. The patient denies recent episodes of angina or shortness of breath.   Previous duplex ultrasound of the venous system shows patent deep venous system no thrombus no reflux  ABI Rt=0.62 and Lt=1.07  (previous ABI Rt=0.52 and Lt=1.04)   Current Meds  Medication Sig  . CREON 24000-76000 units CPEP TK 2 CS PO TID WC  . Glecaprevir-Pibrentasvir (MAVYRET PO) Take by mouth.  Marland Kitchen oxymetazoline (AFRIN) 0.05 % nasal spray Place 1 spray into both nostrils 2 (two) times daily.    Past Medical History:  Diagnosis Date  . Hepatitis C   . Hypertension   . Neuropathy     Past Surgical History:  Procedure Laterality Date  . NO PAST SURGERIES      Social History Social History   Tobacco Use  . Smoking status: Current Some Day Smoker    Packs/day: 1.50    Years: 49.00    Pack years: 73.50    Types: Cigarettes    Last attempt to quit: 2019    Years since quitting: 2.4  . Smokeless tobacco: Never Used  Substance Use Topics  . Alcohol use: Not Currently  . Drug use: Not Currently    Family History Family History  Problem Relation Age of Onset  . Diabetes Mother   . Osteoporosis Mother   . Suicidality Father      Allergies  Allergen Reactions  . Pregabalin Other (See Comments)    "Violent seizures"     REVIEW OF SYSTEMS (Negative unless checked)  Constitutional: [] Weight loss  [] Fever  [] Chills Cardiac: [] Chest pain   [] Chest pressure   [] Palpitations   [] Shortness of breath when laying flat   [] Shortness of breath with exertion. Vascular:  [x] Pain in legs with walking   [] Pain in legs at rest  [] History of DVT   [] Phlebitis   [] Swelling in legs   [] Varicose veins   [] Non-healing ulcers Pulmonary:   [] Uses home oxygen   [] Productive cough   [] Hemoptysis   [] Wheeze  [] COPD   [] Asthma Neurologic:  [] Dizziness   [] Seizures   [] History of stroke   [] History of TIA  [] Aphasia   [] Vissual changes   [] Weakness or numbness in arm   [] Weakness or numbness in leg Musculoskeletal:   [] Joint swelling   [] Joint pain   [] Low back pain Hematologic:  [] Easy bruising  [] Easy bleeding   [] Hypercoagulable state   [] Anemic Gastrointestinal:  [] Diarrhea   [] Vomiting  [] Gastroesophageal reflux/heartburn   [] Difficulty swallowing. Genitourinary:  [] Chronic kidney disease   [] Difficult urination  [] Frequent urination   [] Blood in urine Skin:  [] Rashes   [] Ulcers  Psychological:  [] History of anxiety   []  History of major depression.  Physical Examination  Vitals:   03/17/20  1541  BP: (!) 147/82  Pulse: 71  Resp: 16  Weight: 141 lb (64 kg)  Height: 5\' 6"  (1.676 m)   Body mass index is 22.76 kg/m. Gen: WD/WN, NAD Head: [place]/AT, No temporalis wasting.  Ear/Nose/Throat: Hearing grossly intact, nares w/o erythema or drainage Eyes: PER, EOMI, sclera nonicteric.  Neck: Supple, no large masses.   Pulmonary:  Good air movement, no audible wheezing bilaterally, no use of accessory muscles.  Cardiac: RRR, no JVD Vascular:  Vessel Right Left  Radial Palpable Palpable  PT Not Palpable Not Palpable  DP Not Palpable Not Palpable  Gastrointestinal: Non-distended. No guarding/no peritoneal signs.  Musculoskeletal:  M/S 5/5 throughout.  No deformity or atrophy.  Neurologic: CN 2-12 intact. Symmetrical.  Speech is fluent. Motor exam as listed above. Psychiatric: Judgment intact, Mood & affect appropriate for pt's clinical situation. Dermatologic: No rashes or ulcers noted.  No changes consistent with cellulitis.  CBC Lab Results  Component Value Date   WBC 15.1 (H) 12/11/2018   HGB 14.9 12/11/2018   HCT 44.7 12/11/2018   MCV 71.1 (L) 12/11/2018   PLT 258 12/11/2018    BMET    Component Value Date/Time   NA 140 12/11/2018 1137   K 3.6 12/11/2018 1137   CL 107 12/11/2018 1137   CO2 23 12/11/2018 1137   GLUCOSE 98 12/11/2018 1137   BUN 18 12/11/2018 1137   CREATININE 0.72 12/11/2018 1137   CALCIUM 9.5 12/11/2018 1137   GFRNONAA >60 12/11/2018 1137   GFRAA >60 12/11/2018 1137   CrCl cannot be calculated (Patient's most recent lab result is older than the maximum 21 days allowed.).  COAG No results found for: INR, PROTIME  Radiology No results found.   Assessment/Plan 1. Atherosclerosis of native artery of both lower extremities with intermittent claudication (HCC) Recommend:  The patient has evidence of atherosclerosis of the lower extremities with claudication.  The patient does not voice lifestyle limiting changes at this point in time.  Noninvasive studies do not suggest clinically significant change.  No invasive studies, angiography or surgery at this time The patient should continue walking and begin a more formal exercise program.  The patient should continue antiplatelet therapy and aggressive treatment of the lipid abnormalities  No changes in the patient's medications at this time  The patient should continue wearing graduated compression socks 10-15 mmHg strength to control the mild edema.   - VAS [place] ABI WITH/WO TBI; Future  2. Chronic venous insufficiency No surgery or intervention at this point in time.    I have had a long discussion with the patient  regarding venous insufficiency and why it  causes symptoms. I have discussed with the patient the chronic skin changes that accompany venous insufficiency and the long term sequela such as infection and ulceration.  Patient will begin wearing graduated compression stockings class 1 (20-30 mmHg) or compression wraps on a daily basis a prescription was given. The patient will put the stockings on first thing in the morning and removing them in the evening. The patient is instructed specifically not to sleep in the stockings.    In addition, behavioral modification including several periods of elevation of the lower extremities during the day will be continued. I have demonstrated that proper elevation is a position with the ankles at heart level.  The patient is instructed to begin routine exercise, especially walking on a daily basis  Following the review of the ultrasound the patient will follow up in 2-3 months to  reassess the degree of swelling and the control that graduated compression stockings or compression wraps  is offering.   The patient can be assessed for a Lymph Pump at that time  3. Essential hypertension Continue antihypertensive medications as already ordered, these medications have been reviewed and there are no changes at this time.     Levora Dredge, MD  03/20/2020 5:14 PM

## 2020-05-24 ENCOUNTER — Encounter
Admission: RE | Admit: 2020-05-24 | Discharge: 2020-05-24 | Disposition: A | Discharge status: Home / Self Care | Payer: Medicare Other | Source: Ambulatory Visit | Attending: General Surgery | Admitting: General Surgery

## 2020-05-24 ENCOUNTER — Other Ambulatory Visit: Payer: Self-pay

## 2020-05-24 ENCOUNTER — Ambulatory Visit: Payer: Self-pay | Admitting: General Surgery

## 2020-05-24 DIAGNOSIS — Z01818 Encounter for other preprocedural examination: Secondary | ICD-10-CM | POA: Insufficient documentation

## 2020-05-24 NOTE — H&P (Signed)
PATIENT PROFILE: Tom Pace is a 69 y.o. male who presents to the Clinic for evaluation of bilateral inguinal hernia.  PCP:  Angelena Form, MD  HISTORY OF PRESENT ILLNESS: Tom Pace reports has been previously evaluated for inguinal hernia.  Since initial preparation he reported that the right inguinal hernia is getting larger and more painful.  The patient hold inguinal hernia repair in the past for treatment of hepatitis C.  He completed hepatitis C treatment.  Last hepatitis C titers were nondetected.  This means that he has been responding adequately to the treatment.  The patient reported pain in both inguinal hernias.  The right side is worse.  There is no pain radiation.  Aggravating factor is walking.  Aggravating factor is when he can reduce the hernia.  Sometimes it is getting more difficult to reduce the hernia.  He denies any abdominal distention nausea or vomiting.   PROBLEM LIST:        Problem List  Date Reviewed: 04/04/2020       Noted   Abnormal MRI, cervical spine 12/18/2019   Spinal stenosis of cervical region 12/18/2019   Chronic venous insufficiency 07/26/2019   DJD (degenerative joint disease) of cervical spine 07/26/2019   Essential hypertension 07/26/2019   GERD (gastroesophageal reflux disease) 07/26/2019   Atherosclerosis of native arteries of extremity with intermittent claudication (CMS-HCC) 07/26/2019   Neuropathy 06/28/2019   Atrophy of muscle of multiple sites 06/28/2019   Neck pain 06/28/2019   Chronic pancreatitis (CMS-HCC) 02/25/2019   Overview    Note: Unchanged         GENERAL REVIEW OF SYSTEMS:   General ROS: negative for - chills, fatigue, fever, weight gain or weight loss Allergy and Immunology ROS: negative for - hives  Hematological and Lymphatic ROS: negative for - bleeding problems or bruising, negative for palpable nodes Endocrine ROS: negative for - heat or cold intolerance, hair changes Respiratory ROS:  negative for - cough, shortness of breath or wheezing Cardiovascular ROS: no chest pain or palpitations GI ROS: negative for nausea, vomiting, diarrhea, constipation.  Positive for abdominal pain Musculoskeletal ROS: negative for - joint swelling or muscle pain Neurological ROS: negative for - confusion, syncope Dermatological ROS: negative for pruritus and rash Psychiatric: negative for anxiety, depression, difficulty sleeping and memory loss  MEDICATIONS: Current Medications        Current Outpatient Medications  Medication Sig Dispense Refill  . alpha lipoic acid 300 mg capsule Take 300 mg by mouth once daily    . ARGININE-GLUTAMINE ORAL Take by mouth Arginine AKG -- take 1 heaping tsp twice daily    . b complex vitamins capsule Take 1 capsule by mouth once daily    . biotin 5 mg capsule Take 5 mg by mouth once daily    . BORON ORAL Take 5 mg by mouth 2 (two) times daily    . Boswellia serrata extract (BOSWELLIA SERRATA XT, BULK, MISC) Take 1,150 mg by mouth 2 (two) times daily    . citrulline, bulk, Powd Take 2 g by mouth 2 (two) times daily       . co-enzyme Q-10, ubiquinone, (COQ-10) 100 mg capsule Take 100 mg by mouth once daily    . glecaprevir-pibrentasvir (MAVYRET) 100-40 mg tablet Take 3 tablets by mouth daily Take with food. Do not miss or skip doses. If a dose is missed and it is:  more than 18 hours from the usual time that [place] should have been taken- do not administer  the missed dose and take the next dose at the usual time.  less than 18 hours from the usual time that [place] should have been taken - administer the dose as soon as possible and then to take the next dose at the usual time. 28 tablet 1  . MACA EXTRACT ORAL Take 1,200 mg by mouth 3 (three) times daily       . mv-mn-herbal #208-b-sitosterol 100 mg Tab 1000 mg 1/2 tsp powder daily      . NON FORMULARY Benfotiamine -- (Vit B1) -- 1- 300 mg capsule daily    . pancrelipase (CREON)  36,000-114,000-180,000 unit DR capsule Take 2 caps by mouth prior to meals and 1 capsule by mouth prior to snacks.  Take right before the first bite of food. 240 capsule 2  . RESVERATROL ORAL Take 1,000 mg by mouth once daily    . RUTIN ORAL Take 450 mg by mouth 2 (two) times daily       . saw palmetto xtr/zinc picolin (SAW PALMETTO EXTRACT ORAL) Take 375 mg by mouth once daily     No current facility-administered medications for this visit.      ALLERGIES: Lyrica [pregabalin]  PAST MEDICAL HISTORY:     Past Medical History:  Diagnosis Date  . Abdominal hernia   . Anemia   . Atherosclerosis of native arteries of extremity with intermittent claudication (CMS-HCC)   . Atrophy of muscle of multiple sites   . Chronic venous insufficiency   . GERD (gastroesophageal reflux disease)   . Hypertension   . Neuropathy   . Pancreatitis   . Peripheral neuropathy     PAST SURGICAL HISTORY: History reviewed. No pertinent surgical history.   FAMILY HISTORY:      Family History  Problem Relation Age of Onset  . Diabetes Mother   . Osteoporosis (Thinning of bones) Mother   . Stroke Mother   . Suicidality Father   . Autoimmune disease Sister   . HIV Brother      SOCIAL HISTORY: Social History          Socioeconomic History  . Marital status: Single    Spouse name: Not on file  . Number of children: 0  . Years of education: 59  . Highest education level: 11th grade  Occupational History  . Occupation: Retired Designer, jewellery  Tobacco Use  . Smoking status: Current Every Day Smoker    Types: Pipe  . Smokeless tobacco: Never Used  . Tobacco comment: smokes pipe equivilent to about 3-6 cigarettes  Vaping Use  . Vaping Use: Never used  Substance and Sexual Activity  . Alcohol use: Not Currently  . Drug use: Not Currently  . Sexual activity: Yes    Partners: Female    Birth control/protection: None  Other Topics Concern  . Not on file   Social History Narrative  . Not on file   Social Determinants of Health      Financial Resource Strain:   . Difficulty of Paying Living Expenses:   Food Insecurity:   . Worried About Charity fundraiser in the Last Year:   . Arboriculturist in the Last Year:   Transportation Needs:   . Film/video editor (Medical):   Marland Kitchen Lack of Transportation (Non-Medical):       PHYSICAL EXAM:    Vitals:   05/10/20 1432  BP: (!) 169/90  Pulse: 76   Body mass index is 22.92 kg/m. Weight: 64.4 kg (142  lb)   GENERAL: Alert, active, oriented x3  HEENT: Pupils equal reactive to light. Extraocular movements are intact. Sclera clear. Palpebral conjunctiva normal red color.Pharynx clear.  NECK: Supple with no palpable mass and no adenopathy.  LUNGS: Sound clear with no rales rhonchi or wheezes.  HEART: Regular rhythm S1 and S2 without murmur.  ABDOMEN: Soft and depressible, nontender with no palpable mass, no hepatomegaly.  Bilateral inguinal hernia bigger on the right than on the left.  Reducible.  EXTREMITIES: Well-developed well-nourished symmetrical with no dependent edema.  NEUROLOGICAL: Awake alert oriented, facial expression symmetrical, moving all extremities.  REVIEW OF DATA: I have reviewed the following data today: Appointment on 04/11/2020  Component Date Value  . Hepatitis C Quantitation* 04/11/2020 HCV Not Detected   . Test Information: 04/11/2020 Comment   . WBC (White Blood Cell Co* 04/11/2020 13.1*  . RBC (Red Blood Cell Coun* 04/11/2020 5.41   . Hemoglobin 04/11/2020 13.7*  . Hematocrit 04/11/2020 40.5   . MCV (Mean Corpuscular Vo* 04/11/2020 74.9*  . MCH (Mean Corpuscular He* 04/11/2020 25.3*  . MCHC (Mean Corpuscular H* 04/11/2020 33.8   . Platelet Count 04/11/2020 206   . RDW-CV (Red Cell Distrib* 04/11/2020 16.1*  . MPV (Mean Platelet Volum* 04/11/2020 9.5   . Neutrophils 04/11/2020 7.99*  . Lymphocytes 04/11/2020 3.95*  . Monocytes  04/11/2020 0.73   . Eosinophils 04/11/2020 0.35   . Basophils 04/11/2020 0.06   . Neutrophil % 04/11/2020 60.9   . Lymphocyte % 04/11/2020 30.1   . Monocyte % 04/11/2020 5.6   . Eosinophil % 04/11/2020 2.7   . Basophil% 04/11/2020 0.5   . Immature Granulocyte % 04/11/2020 0.2   . Immature Granulocyte Cou* 04/11/2020 0.03   . Glucose 04/11/2020 92   . Sodium 04/11/2020 139   . Potassium 04/11/2020 3.8   . Chloride 04/11/2020 105   . Carbon Dioxide (CO2) 04/11/2020 30.2   . Urea Nitrogen (BUN) 04/11/2020 23   . Creatinine 04/11/2020 0.8   . Glomerular Filtration Ra* 04/11/2020 97   . Calcium 04/11/2020 9.5   . AST  04/11/2020 19   . ALT  04/11/2020 16   . Alk Phos (alkaline Phosp* 04/11/2020 69   . Albumin 04/11/2020 4.1   . Bilirubin, Total 04/11/2020 0.6   . Protein, Total 04/11/2020 6.9   . A/G Ratio 04/11/2020 1.5   Ancillary Orders on 03/31/2020  Component Date Value  . WBC (White Blood Cell Co* 03/31/2020 10.2   . RBC (Red Blood Cell Coun* 03/31/2020 5.42   . Hemoglobin 03/31/2020 14.0*  . Hematocrit 03/31/2020 41.3   . MCV (Mean Corpuscular Vo* 03/31/2020 76.2*  . MCH (Mean Corpuscular He* 03/31/2020 25.8*  . MCHC (Mean Corpuscular H* 03/31/2020 33.9   . Platelet Count 03/31/2020 204   . RDW-CV (Red Cell Distrib* 03/31/2020 16.8*  . MPV (Mean Platelet Volum* 03/31/2020 10.5   . Neutrophils 03/31/2020 5.60   . Lymphocytes 03/31/2020 3.43   . Monocytes 03/31/2020 0.67   . Eosinophils 03/31/2020 0.41   . Basophils 03/31/2020 0.09   . Neutrophil % 03/31/2020 54.7   . Lymphocyte % 03/31/2020 33.5   . Monocyte % 03/31/2020 6.5   . Eosinophil % 03/31/2020 4.0   . Basophil% 03/31/2020 0.9   . Immature Granulocyte % 03/31/2020 0.4   . Immature Granulocyte Cou* 03/31/2020 0.04   . Glucose 03/31/2020 124*  . Sodium 03/31/2020 140   . Potassium 03/31/2020 3.6   . Chloride 03/31/2020 107   . Carbon  Dioxide (CO2) 03/31/2020 28.1   . Urea Nitrogen (BUN) 03/31/2020 20   .  Creatinine 03/31/2020 1.0   . Glomerular Filtration Ra* 03/31/2020 75   . Calcium 03/31/2020 9.4   . AST  03/31/2020 19   . ALT  03/31/2020 17   . Alk Phos (alkaline Phosp* 03/31/2020 68   . Albumin 03/31/2020 4.1   . Bilirubin, Total 03/31/2020 0.5   . Protein, Total 03/31/2020 7.1   . A/G Ratio 03/31/2020 1.4   Ancillary Orders on 02/16/2020  Component Date Value  . Fibrosis Score - [facility] 02/16/2020 0.63*  . Fibrosis Stage - [facility] 02/16/2020 Comment   . Necroinflammat Activity * 02/16/2020 0.36*  . Necroinflammat Activity * 02/16/2020 A1-Minimal activity   . Alpha 2-Macroglobulins, * 02/16/2020 208   . Haptoglobin - [facility] 02/16/2020 52   . Apolipoprotein A-1 - Lab* 02/16/2020 113   . Bilirubin, Total - [facility]* 02/16/2020 0.7   . GGT - [facility] 02/16/2020 33   . ALT (SGPT) P5P - [facility] 02/16/2020 45   . Interpretations: - [facility]* 02/16/2020 Comment   . Fibrosis Scoring: - [facility] 02/16/2020 Comment   . Necroinflamm Activity [place]* 02/16/2020 Comment   . Limitations: - [facility] 02/16/2020 Comment   . Comment: - [facility] 02/16/2020 Comment   . Hep A IgM - [facility] 02/16/2020 Negative   . Hepatitis B Surface Anti* 02/16/2020 Negative   . Hepatitis B Core IgM Ant* 02/16/2020 Negative   . Hep C Virus Ab - [facility] 02/16/2020 >11.0*  Office Visit on 02/11/2020  Component Date Value  . HCV Quant Baseline - Lab* 02/11/2020 017793   . HCV log10 - [facility] 02/11/2020 5.805   . Test Information: - [facility]* 02/11/2020 Comment   . HCV Genotype - [facility] 02/11/2020 Comment   . PSA (Prostate Specific A* 02/11/2020 0.94   . Hepatitis C Genotype - L* 02/11/2020 1a   . Please note: - [facility] 02/11/2020 Comment      ASSESSMENT: Tom Pace is a 69 y.o. male presenting for consultation for bilateral inguinal hernia.    The patient presents with a symptomatic, reducible inguinal hernia. Patient was oriented about the diagnosis of inguinal hernia and its implication. The patient was oriented about  the treatment alternatives (observation vs surgical repair). Due to patient symptoms, repair is recommended. Patient oriented about the surgical procedure, the use of mesh and its risk of complications such as: infection, bleeding, injury to vas deference, vasculature and testicle, injury to bowel or bladder, and chronic pain.  Bilateral inguinal hernia without obstruction or gangrene, recurrence not specified [K40.20]  PLAN: 1. Robotic assisted laparoscopic bilateral inguinal hernia repair with mesh (90300) 2.  CBC, CMP done 3.  Avoid taking aspirin 5 days before procedure 4.  Internal medicine Clearance 5.  Contact us if has any question or concern.  Patient verbalized understanding, all questions were answered, and were agreeable with the plan outlined above.    Herbert Pun, MD  Electronically signed by Herbert Pun, MD

## 2020-05-24 NOTE — Patient Instructions (Signed)
Your procedure is scheduled on: Wednesday 06/01/20.  Report to DAY SURGERY DEPARTMENT LOCATED ON 2ND FLOOR MEDICAL MALL ENTRANCE. To find out your arrival time please call [phone] between 1PM - 3PM on Tuesday 05/31/20.   Remember: Instructions that are not followed completely may result in serious medical risk, up to and including death, or upon the discretion of your surgeon and anesthesiologist your surgery may need to be rescheduled.     __X__ 1. Do not eat food after midnight the night before your procedure.                 No gum chewing or hard candies. You may drink clear liquids up to 2 hours                 before you are scheduled to arrive for your surgery- DO NOT drink clear                 liquids within 2 hours of the start of your surgery.                 Clear Liquids include:  water, apple juice without pulp, clear carbohydrate                 drink such as Clearfast or Gatorade, Black Coffee or Tea (Do not add                 milk or creamer to coffee or tea).   __X__2.  On the morning of surgery brush your teeth with toothpaste and water, you may rinse your mouth with mouthwash if you wish.  Do not swallow any toothpaste or mouthwash.    __X__ 3.  No Alcohol for 24 hours before or after surgery.  __X__ 4.  Do Not Smoke or use e-cigarettes For 24 Hours Prior to Your Surgery.                 Do not use any chewable tobacco products for at least 6 hours prior to                 surgery.  __X__5.  Notify your doctor if there is any change in your medical condition      (cold, fever, infections).      Do NOT wear jewelry, make-up, hairpins, clips or nail polish. Do NOT wear lotions, powders, or perfumes.  Do NOT shave 48 hours prior to surgery. Men may shave face and neck. Do NOT bring valuables to the hospital.     [facility] is not responsible for any belongings or valuables.   Contacts, dentures/partials or body piercings may not be worn into surgery. Bring  a case for your contacts, glasses or hearing aids, a denture cup will be supplied.     Patients discharged the day of surgery will not be allowed to drive home.     __X__ Take these medicines the morning of surgery with A SIP OF WATER:     1. NONE     __X__ Use CHG Soap as directed  __X__ Stop Anti-inflammatories 7 days before surgery such as Advil, Ibuprofen, Motrin, BC or Goodies Powder, Naprosyn, Naproxen, Aleve, Aspirin, Meloxicam. May take Tylenol if needed for pain or discomfort.   __X__Do not start taking any new herbal supplements or vitamins prior to your procedure.  __X__ Stop the following herbal supplements or vitamins:  All of them   Wear comfortable clothing (specific to your surgery type) to  the hospital.  Plan for stool softeners for home use; pain medications have a tendency to cause constipation. You can also help prevent constipation by eating foods high in fiber such as fruits and vegetables and drinking plenty of fluids as your diet allows.  After surgery, you can prevent lung complications by doing breathing exercises.Take deep breaths and cough every 1-2 hours. Your doctor may order a device called an Incentive Spirometer to help you take deep breaths.  Please call the [place] Department at [phone] if you have any questions about these instructions.

## 2020-05-31 ENCOUNTER — Other Ambulatory Visit: Admission: RE | Admit: 2020-05-31 | Payer: Medicare Other | Source: Ambulatory Visit

## 2020-06-01 ENCOUNTER — Ambulatory Visit: Admit: 2020-06-01 | Payer: Medicare Other | Admitting: General Surgery

## 2020-06-01 SURGERY — REPAIR, HERNIA, INGUINAL, ROBOT-ASSISTED, LAPAROSCOPIC, USING MESH
Anesthesia: General | Site: Groin | Laterality: Bilateral

## 2020-08-02 ENCOUNTER — Inpatient Hospital Stay
Admission: EM | Admit: 2020-08-02 | Discharge: 2020-08-02 | DRG: 394 | Discharge status: Against Medical Advice | Payer: Medicare Other | Attending: Internal Medicine | Admitting: Internal Medicine

## 2020-08-02 ENCOUNTER — Emergency Department: Payer: Medicare Other

## 2020-08-02 ENCOUNTER — Encounter: Payer: Self-pay | Admitting: Emergency Medicine

## 2020-08-02 DIAGNOSIS — Z888 Allergy status to other drugs, medicaments and biological substances status: Secondary | ICD-10-CM | POA: Diagnosis not present

## 2020-08-02 DIAGNOSIS — G629 Polyneuropathy, unspecified: Secondary | ICD-10-CM | POA: Diagnosis present

## 2020-08-02 DIAGNOSIS — Z79899 Other long term (current) drug therapy: Secondary | ICD-10-CM

## 2020-08-02 DIAGNOSIS — N390 Urinary tract infection, site not specified: Secondary | ICD-10-CM | POA: Diagnosis not present

## 2020-08-02 DIAGNOSIS — R110 Nausea: Secondary | ICD-10-CM

## 2020-08-02 DIAGNOSIS — Z20822 Contact with and (suspected) exposure to covid-19: Secondary | ICD-10-CM | POA: Diagnosis present

## 2020-08-02 DIAGNOSIS — E872 Acidosis: Secondary | ICD-10-CM | POA: Diagnosis present

## 2020-08-02 DIAGNOSIS — E876 Hypokalemia: Secondary | ICD-10-CM | POA: Diagnosis present

## 2020-08-02 DIAGNOSIS — R7989 Other specified abnormal findings of blood chemistry: Secondary | ICD-10-CM

## 2020-08-02 DIAGNOSIS — K219 Gastro-esophageal reflux disease without esophagitis: Secondary | ICD-10-CM | POA: Diagnosis present

## 2020-08-02 DIAGNOSIS — Z5329 Procedure and treatment not carried out because of patient's decision for other reasons: Secondary | ICD-10-CM | POA: Diagnosis present

## 2020-08-02 DIAGNOSIS — F17210 Nicotine dependence, cigarettes, uncomplicated: Secondary | ICD-10-CM | POA: Diagnosis present

## 2020-08-02 DIAGNOSIS — I10 Essential (primary) hypertension: Secondary | ICD-10-CM | POA: Diagnosis present

## 2020-08-02 DIAGNOSIS — K56600 Partial intestinal obstruction, unspecified as to cause: Secondary | ICD-10-CM

## 2020-08-02 DIAGNOSIS — K56609 Unspecified intestinal obstruction, unspecified as to partial versus complete obstruction: Secondary | ICD-10-CM | POA: Diagnosis present

## 2020-08-02 DIAGNOSIS — Z8619 Personal history of other infectious and parasitic diseases: Secondary | ICD-10-CM | POA: Diagnosis not present

## 2020-08-02 DIAGNOSIS — M47812 Spondylosis without myelopathy or radiculopathy, cervical region: Secondary | ICD-10-CM | POA: Diagnosis present

## 2020-08-02 DIAGNOSIS — M47816 Spondylosis without myelopathy or radiculopathy, lumbar region: Secondary | ICD-10-CM | POA: Diagnosis present

## 2020-08-02 DIAGNOSIS — K4090 Unilateral inguinal hernia, without obstruction or gangrene, not specified as recurrent: Secondary | ICD-10-CM

## 2020-08-02 DIAGNOSIS — D649 Anemia, unspecified: Secondary | ICD-10-CM | POA: Diagnosis present

## 2020-08-02 DIAGNOSIS — I70219 Atherosclerosis of native arteries of extremities with intermittent claudication, unspecified extremity: Secondary | ICD-10-CM | POA: Diagnosis present

## 2020-08-02 DIAGNOSIS — R1030 Lower abdominal pain, unspecified: Secondary | ICD-10-CM

## 2020-08-02 DIAGNOSIS — K4030 Unilateral inguinal hernia, with obstruction, without gangrene, not specified as recurrent: Secondary | ICD-10-CM | POA: Diagnosis present

## 2020-08-02 HISTORY — DX: Unspecified convulsions: R56.9

## 2020-08-02 HISTORY — DX: Acute pancreatitis without necrosis or infection, unspecified: K85.90

## 2020-08-02 LAB — CBC WITH DIFFERENTIAL/PLATELET
Abs Immature Granulocytes: 0.12 10*3/uL — ABNORMAL HIGH (ref 0.00–0.07)
Basophils Absolute: 0.1 10*3/uL (ref 0.0–0.1)
Basophils Relative: 1 %
Eosinophils Absolute: 0.5 10*3/uL (ref 0.0–0.5)
Eosinophils Relative: 3 %
HCT: 38.2 % — ABNORMAL LOW (ref 39.0–52.0)
Hemoglobin: 13.1 g/dL (ref 13.0–17.0)
Immature Granulocytes: 1 %
Lymphocytes Relative: 21 %
Lymphs Abs: 3.7 10*3/uL (ref 0.7–4.0)
MCH: 24.9 pg — ABNORMAL LOW (ref 26.0–34.0)
MCHC: 34.3 g/dL (ref 30.0–36.0)
MCV: 72.5 fL — ABNORMAL LOW (ref 80.0–100.0)
Monocytes Absolute: 0.8 10*3/uL (ref 0.1–1.0)
Monocytes Relative: 5 %
Neutro Abs: 12.6 10*3/uL — ABNORMAL HIGH (ref 1.7–7.7)
Neutrophils Relative %: 69 %
Platelets: 225 10*3/uL (ref 150–400)
RBC: 5.27 MIL/uL (ref 4.22–5.81)
RDW: 18 % — ABNORMAL HIGH (ref 11.5–15.5)
WBC: 17.8 10*3/uL — ABNORMAL HIGH (ref 4.0–10.5)
nRBC: 0.2 % (ref 0.0–0.2)

## 2020-08-02 LAB — URINALYSIS, COMPLETE (UACMP) WITH MICROSCOPIC
Bilirubin Urine: NEGATIVE
Glucose, UA: NEGATIVE mg/dL
Hgb urine dipstick: NEGATIVE
Ketones, ur: NEGATIVE mg/dL
Nitrite: NEGATIVE
Protein, ur: NEGATIVE mg/dL
Specific Gravity, Urine: 1.028 (ref 1.005–1.030)
pH: 7 (ref 5.0–8.0)

## 2020-08-02 LAB — COMPREHENSIVE METABOLIC PANEL
ALT: 15 U/L (ref 0–44)
AST: 28 U/L (ref 15–41)
Albumin: 4.5 g/dL (ref 3.5–5.0)
Alkaline Phosphatase: 72 U/L (ref 38–126)
Anion gap: 13 (ref 5–15)
BUN: 23 mg/dL (ref 8–23)
CO2: 22 mmol/L (ref 22–32)
Calcium: 9.5 mg/dL (ref 8.9–10.3)
Chloride: 105 mmol/L (ref 98–111)
Creatinine, Ser: 0.86 mg/dL (ref 0.61–1.24)
GFR, Estimated: >60 mL/min (ref >60)
Glucose, Bld: 186 mg/dL — ABNORMAL HIGH (ref 70–99)
Potassium: 3.2 mmol/L — ABNORMAL LOW (ref 3.5–5.1)
Sodium: 140 mmol/L (ref 135–145)
Total Bilirubin: 1 mg/dL (ref 0.3–1.2)
Total Protein: 7.6 g/dL (ref 6.5–8.1)

## 2020-08-02 LAB — CBC
HCT: 35.4 % — ABNORMAL LOW (ref 39.0–52.0)
Hemoglobin: 12 g/dL — ABNORMAL LOW (ref 13.0–17.0)
MCH: 24.7 pg — ABNORMAL LOW (ref 26.0–34.0)
MCHC: 33.9 g/dL (ref 30.0–36.0)
MCV: 72.8 fL — ABNORMAL LOW (ref 80.0–100.0)
Platelets: 198 10*3/uL (ref 150–400)
RBC: 4.86 MIL/uL (ref 4.22–5.81)
RDW: 18 % — ABNORMAL HIGH (ref 11.5–15.5)
WBC: 13.5 10*3/uL — ABNORMAL HIGH (ref 4.0–10.5)
nRBC: 0 % (ref 0.0–0.2)

## 2020-08-02 LAB — RESPIRATORY PANEL BY RT PCR (FLU A&B, COVID)
Influenza A by PCR: NEGATIVE
Influenza B by PCR: NEGATIVE
SARS Coronavirus 2 by RT PCR: NEGATIVE

## 2020-08-02 LAB — ETHANOL: Alcohol, Ethyl (B): <10 mg/dL (ref <10)

## 2020-08-02 LAB — URINE DRUG SCREEN, QUALITATIVE (ARMC ONLY)
Amphetamines, Ur Screen: NOT DETECTED
Barbiturates, Ur Screen: NOT DETECTED
Benzodiazepine, Ur Scrn: NOT DETECTED
Cannabinoid 50 Ng, Ur ~~LOC~~: POSITIVE — AB
Cocaine Metabolite,Ur ~~LOC~~: NOT DETECTED
MDMA (Ecstasy)Ur Screen: NOT DETECTED
Methadone Scn, Ur: NOT DETECTED
Opiate, Ur Screen: NOT DETECTED
Phencyclidine (PCP) Ur S: NOT DETECTED
Tricyclic, Ur Screen: NOT DETECTED

## 2020-08-02 LAB — AMYLASE: Amylase: 48 U/L (ref 28–100)

## 2020-08-02 LAB — BASIC METABOLIC PANEL
Anion gap: 9 (ref 5–15)
BUN: 18 mg/dL (ref 8–23)
CO2: 25 mmol/L (ref 22–32)
Calcium: 8.7 mg/dL — ABNORMAL LOW (ref 8.9–10.3)
Chloride: 105 mmol/L (ref 98–111)
Creatinine, Ser: 0.77 mg/dL (ref 0.61–1.24)
GFR, Estimated: >60 mL/min (ref >60)
Glucose, Bld: 96 mg/dL (ref 70–99)
Potassium: 3.6 mmol/L (ref 3.5–5.1)
Sodium: 139 mmol/L (ref 135–145)

## 2020-08-02 LAB — CBG MONITORING, ED: Glucose-Capillary: 173 mg/dL — ABNORMAL HIGH (ref 70–99)

## 2020-08-02 LAB — TROPONIN I (HIGH SENSITIVITY)
Troponin I (High Sensitivity): 5 ng/L (ref <18)
Troponin I (High Sensitivity): 7 ng/L (ref <18)

## 2020-08-02 LAB — LIPASE, BLOOD: Lipase: 28 U/L (ref 11–51)

## 2020-08-02 LAB — HIV ANTIBODY (ROUTINE TESTING W REFLEX): HIV Screen 4th Generation wRfx: NONREACTIVE

## 2020-08-02 LAB — LACTIC ACID, PLASMA
Lactic Acid, Venous: 3.2 mmol/L (ref 0.5–1.9)
Lactic Acid, Venous: 2.5 mmol/L (ref 0.5–1.9)

## 2020-08-02 MED ORDER — SODIUM CHLORIDE 0.9 % IV BOLUS
1000.0000 mL | Freq: Once | INTRAVENOUS | Status: AC
  Administered 2020-08-02: 1000 mL via INTRAVENOUS

## 2020-08-02 MED ORDER — VITAMIN D 125 MCG (5000 UT) PO CAPS: 5000.0000 [IU] | ORAL_CAPSULE | Freq: Every day | ORAL | Status: DC

## 2020-08-02 MED ORDER — MORPHINE SULFATE (PF) 2 MG/ML IV SOLN
2.0000 mg | INTRAVENOUS | Status: DC | PRN
Start: 2020-08-02 — End: 2020-08-02

## 2020-08-02 MED ORDER — SODIUM CHLORIDE 0.9 % IV SOLN
1.0000 g | INTRAVENOUS | Status: DC
  Administered 2020-08-02: 1 g via INTRAVENOUS
  Filled 2020-08-02: qty 10

## 2020-08-02 MED ORDER — PANCRELIPASE (LIP-PROT-AMYL) 12000-38000 UNITS PO CPEP
12000.0000 [IU] | ORAL_CAPSULE | Freq: Three times a day (TID) | ORAL | Status: DC
  Filled 2020-08-02 (×3): qty 1

## 2020-08-02 MED ORDER — MORPHINE SULFATE (PF) 2 MG/ML IV SOLN: 2.0000 mg | INTRAVENOUS | Status: DC | PRN

## 2020-08-02 MED ORDER — KETOROLAC TROMETHAMINE 30 MG/ML IJ SOLN
30.0000 mg | Freq: Once | INTRAMUSCULAR | Status: AC
  Administered 2020-08-02: 30 mg via INTRAVENOUS
  Filled 2020-08-02: qty 1

## 2020-08-02 MED ORDER — SODIUM CHLORIDE 0.9 % IV SOLN: INTRAVENOUS | Status: DC

## 2020-08-02 MED ORDER — SALINE SPRAY 0.65 % NA SOLN
1.0000 | NASAL | Status: DC | PRN
  Filled 2020-08-02 (×2): qty 44

## 2020-08-02 MED ORDER — IOHEXOL 300 MG/ML  SOLN
100.0000 mL | Freq: Once | INTRAMUSCULAR | Status: AC | PRN
  Administered 2020-08-02: 100 mL via INTRAVENOUS

## 2020-08-02 MED ORDER — LACTATED RINGERS IV BOLUS
1000.0000 mL | Freq: Once | INTRAVENOUS | Status: AC
  Administered 2020-08-02: 1000 mL via INTRAVENOUS

## 2020-08-02 MED ORDER — THIAMINE HCL 100 MG PO TABS: 300.0000 mg | ORAL_TABLET | Freq: Every day | ORAL | Status: DC

## 2020-08-02 MED ORDER — ZINC PICOLINATE 25 MG PO TABS: 25.0000 mg | ORAL_TABLET | Freq: Every day | ORAL | Status: DC

## 2020-08-02 MED ORDER — ONDANSETRON HCL 4 MG/2ML IJ SOLN
4.0000 mg | Freq: Once | INTRAMUSCULAR | Status: AC
  Administered 2020-08-02: 4 mg via INTRAVENOUS
  Filled 2020-08-02: qty 2

## 2020-08-02 MED ORDER — ACETAMINOPHEN 650 MG RE SUPP: 650.0000 mg | Freq: Four times a day (QID) | RECTAL | Status: DC | PRN

## 2020-08-02 MED ORDER — ACETAMINOPHEN 325 MG PO TABS: 650.0000 mg | ORAL_TABLET | Freq: Four times a day (QID) | ORAL | Status: DC | PRN

## 2020-08-02 MED ORDER — MORPHINE SULFATE (PF) 4 MG/ML IV SOLN
4.0000 mg | Freq: Once | INTRAVENOUS | Status: AC
  Administered 2020-08-02: 4 mg via INTRAVENOUS
  Filled 2020-08-02: qty 1

## 2020-08-02 MED ORDER — ADULT MULTIVITAMIN W/MINERALS CH: 1.0000 | ORAL_TABLET | Freq: Every day | ORAL | Status: DC

## 2020-08-02 MED ORDER — TRAZODONE HCL 50 MG PO TABS: 25.0000 mg | ORAL_TABLET | Freq: Every evening | ORAL | Status: DC | PRN

## 2020-08-02 MED ORDER — POTASSIUM CITRATE 99 MG PO CAPS: 99.0000 mg | ORAL_CAPSULE | Freq: Every day | ORAL | Status: DC

## 2020-08-02 NOTE — ED Provider Notes (Signed)
[facility] Emergency Department Provider Note   ____________________________________________   First MD Initiated Contact with Patient 08/02/20 0024     (approximate)  I have reviewed the triage vital signs and the nursing notes.   HISTORY  Chief Complaint Abdominal Pain    HPI Tom Pace is a 69 y.o. male with a stated past medical history of hepatitis C in remission, hypertension, right inguinal hernia, chronic bilateral upper and lower extremity neuropathy, and chronic pancreatitis who presents for periumbilical abdominal pain that began approximately 6 hours prior to arrival.  Patient describes 10/10, sharp, nonradiating abdominal pain that has been worsening since onset and is associated with nausea without vomiting.  Patient states that this pain is different than his normal pancreatitis pain.  Patient states last p.o. intake at 1230 this afternoon         Past Medical History:  Diagnosis Date  . Hepatitis C   . Hypertension   . Neuropathy   . Pancreatitis   . Seizures (HCC)    stated had a seizure after he took lyrica     Patient Active Problem List   Diagnosis Date Noted  . SBO (small bowel obstruction) (HCC) 08/02/2020  . Abnormal MRI, cervical spine 12/18/2019  . Spinal stenosis of cervical region 12/18/2019  . Leg pain 07/26/2019  . Atherosclerosis of native arteries of extremity with intermittent claudication (HCC) 07/26/2019  . Degenerative joint disease (DJD) of lumbar spine 07/26/2019  . DJD (degenerative joint disease) of cervical spine 07/26/2019  . Essential hypertension 07/26/2019  . GERD (gastroesophageal reflux disease) 07/26/2019  . Chronic venous insufficiency 07/26/2019  . Atrophy of muscle of multiple sites 06/28/2019  . Neck pain 06/28/2019  . Neuropathy 06/28/2019  . Chronic pancreatitis (HCC) 02/25/2019    Past Surgical History:  Procedure Laterality Date  . NO PAST SURGERIES      Prior to Admission  medications   Medication Sig Start Date End Date Taking? Authorizing Provider  Acetylcarnitine HCl (ACETYL L-CARNITINE PO) Take 600 mg by mouth daily.    [provider]  Alpha-Lipoic Acid 300 MG TABS Take 300 mg by mouth in the morning and at bedtime.    [provider]  Arginine 2000 MG PACK Take 2 g by mouth in the morning and at bedtime.    [provider]  ASTAXANTHIN PO Take 10 mg by mouth daily.    [provider]  b complex vitamins tablet Take 1 tablet by mouth daily.    [provider]  Bioflavonoid Products (ESTER C PO) Take 1,000 mg by mouth in the morning and at bedtime.    [provider]  BORON PO Take 5 mg by mouth in the morning and at bedtime.    [provider]  Boswellia Serrata (BOSWELLIA PO) Take 800 mg by mouth in the morning and at bedtime.    [provider]  Cats Claw 400 MG CAPS Take 400 mg by mouth daily.    [provider]  Cholecalciferol (VITAMIN D) 125 MCG (5000 UT) CAPS Take 5,000 Units by mouth daily.    [provider]  Citrulline (CITRULLINE 1000) 1 g PACK Take 2 g by mouth in the morning and at bedtime.    [provider]  COLLAGEN PO Take 1 g by mouth in the morning and at bedtime.    [provider]  CREON 24000-76000 units CPEP TK 2 CS PO TID [facility] 12/11/18   [provider]  famotidine (PEPCID) 20 MG tablet Take 1 tablet (20 mg total) by mouth at bedtime. Patient not taking: Reported on 05/20/2020 12/09/18 12/09/19  Nita Sickle, MD  Glecaprevir-Pibrentasvir (MAVYRET PO) Take by mouth. Patient not taking: Reported on 05/20/2020    [provider]  levOCARNitine (CARNITINE, L,) POWD Take 500 mg by mouth daily.    [provider]  lisinopril (ZESTRIL) 10 MG tablet TK 1 T PO QAM Patient not taking: Reported on 03/17/2020 04/20/19   [provider]  Maca 500 MG CAPS Take 500 mg by mouth in the morning, at noon, and at  bedtime.    [provider]  MAGNESIUM CITRATE PO Take 2,500 mg by mouth in the morning and at bedtime.    [provider]  Methylcobalamin (METHYL B-12 PO) Take 1,000 mg by mouth in the morning and at bedtime.    [provider]  MILK THISTLE EXTRACT PO Take 1 capsule by mouth in the morning and at bedtime.    [provider]  Misc Natural Products (BETA-SITOSTEROL PLANT STEROLS) CAPS Take 1 capsule by mouth daily.    [provider]  Multiple Vitamin (MULTIVITAMIN WITH MINERALS) TABS tablet Take 1 tablet by mouth daily.    [provider]  Multiple Vitamins-Minerals (PRESERVISION AREDS 2 PO) Take 1 capsule by mouth in the morning and at bedtime.    [provider]  Nettle, Urtica Dioica, (NETTLE LEAF) 435 MG CAPS Take by mouth.    [provider]  ondansetron (ZOFRAN ODT) 4 MG disintegrating tablet Take 1 tablet (4 mg total) by mouth every 8 (eight) hours as needed. Patient not taking: Reported on 07/23/2019 12/11/18   Rockne Menghini, MD  OVER THE COUNTER MEDICATION Take 1 capsule by mouth in the morning and at bedtime. Adaptogenic Complex    [provider]  OVER THE COUNTER MEDICATION Take 500 mg by mouth in the morning, at noon, and at bedtime. D Ribose    [provider]  oxymetazoline (AFRIN) 0.05 % nasal spray Place 1 spray into both nostrils 2 (two) times daily as needed for congestion.     [provider]  pantoprazole (PROTONIX) 40 MG tablet  03/24/19   [provider]  Potassium Citrate 99 MG CAPS Take 99 mg by mouth daily.    [provider]  RESVERATROL PO Take 500-1,000 mg by mouth See admin instructions. Take 1000 mg by mouth in the morning and 500 mg at night    [provider]  rOPINIRole (REQUIP) 0.25 MG tablet  03/30/19   [provider]  RUTIN PO Take 1 tablet by mouth in the morning and at bedtime.    [provider]  Sodium  Hyaluronate, oral, (HYALURONIC ACID) 100 MG CAPS Take 250 mg by mouth every morning.    [provider]  Thiamine Mononitrate (B1 PO) Take 300 mg by mouth daily.    [provider]  traMADol-acetaminophen (ULTRACET) 37.5-325 MG tablet TK 1 T PO Q 6 H Patient not taking: Reported on 03/17/2020 02/25/19   [provider]  Zinc Picolinate 25 MG TABS Take 25 mg by mouth daily.    [provider]    Allergies Pregabalin  Family History  Problem Relation Age of Onset  . Diabetes Mother   . Osteoporosis Mother   . Suicidality Father     Social History Social History   Tobacco Use  . Smoking status: Current Some Day Smoker    Packs/day: 1.50  Years: 49.00    Pack years: 73.50    Types: Cigarettes    Last attempt to quit: 2019    Years since quitting: 2.8  . Smokeless tobacco: Never Used  Vaping Use  . Vaping Use: Never used  Substance Use Topics  . Alcohol use: Not Currently  . Drug use: Not Currently    Types: Marijuana    Review of Systems Constitutional: No fever/chills Eyes: No visual changes. ENT: No sore throat. Cardiovascular: Denies chest pain. Respiratory: Denies shortness of breath. Gastrointestinal: Endorses abdominal pain.  Endorses nausea, no vomiting.  No diarrhea. Genitourinary: Negative for dysuria. Musculoskeletal: Negative for acute arthralgias Skin: Negative for rash. Neurological: Negative for headaches, weakness/numbness/paresthesias in any extremity Psychiatric: Negative for suicidal ideation/homicidal ideation   ____________________________________________   PHYSICAL EXAM:  VITAL SIGNS: ED Triage Vitals  Enc Vitals Group     BP 08/02/20 0030 (!) 152/132     Pulse Rate 08/02/20 0030 63     Resp 08/02/20 0030 16     Temp --      Temp src --      SpO2 08/02/20 0030 98 %     Weight --      Height --      Head Circumference --      Peak Flow --      Pain Score 08/02/20 0033 10     Pain Loc --      Pain  Edu? --      Excl. in GC? --    Constitutional: Alert and oriented. Well appearing and in no acute distress. Eyes: Conjunctivae are normal. PERRL. Head: Atraumatic. Nose: No congestion/rhinnorhea. Mouth/Throat: Mucous membranes are moist. Neck: No stridor Cardiovascular: Grossly normal heart sounds.  Good peripheral circulation. Respiratory: Normal respiratory effort.  No retractions. Gastrointestinal: Soft and nontender. No distention. GU: Normal external male genitalia with large mass appreciated in the right inguinal region with palpable GI contents that are fully reducible Musculoskeletal: No obvious deformities Neurologic:  Normal speech and language. No gross focal neurologic deficits are appreciated. Skin:  Skin is warm and dry. No rash noted. Psychiatric: Mood and affect are normal. Speech and behavior are normal.  ____________________________________________   LABS (all labs ordered are listed, but only abnormal results are displayed)  Labs Reviewed  COMPREHENSIVE METABOLIC PANEL - Abnormal; Notable for the following components:      Result Value   Potassium 3.2 (*)    Glucose, Bld 186 (*)    All other components within normal limits  LACTIC ACID, PLASMA - Abnormal; Notable for the following components:   Lactic Acid, Venous 3.2 (*)    All other components within normal limits  CBC WITH DIFFERENTIAL/PLATELET - Abnormal; Notable for the following components:   WBC 17.8 (*)    HCT 38.2 (*)    MCV 72.5 (*)    MCH 24.9 (*)    RDW 18.0 (*)    Neutro Abs 12.6 (*)    Abs Immature Granulocytes 0.12 (*)    All other components within normal limits  CBG MONITORING, ED - Abnormal; Notable for the following components:   Glucose-Capillary 173 (*)    All other components within normal limits  RESPIRATORY PANEL BY RT PCR (FLU A&B, COVID)  ETHANOL  LIPASE, BLOOD  AMYLASE  LACTIC ACID, PLASMA  URINE DRUG SCREEN, QUALITATIVE ([facility] ONLY)  URINALYSIS, COMPLETE (UACMP) WITH  MICROSCOPIC  HIV ANTIBODY (ROUTINE TESTING W REFLEX)  BASIC METABOLIC PANEL  CBC  TROPONIN I (HIGH SENSITIVITY)  TROPONIN I (HIGH SENSITIVITY)   ____________________________________________  EKG  ED ECG REPORT I, Merwyn KatosEvan K Ebelin Dillehay, the attending physician, personally viewed and interpreted this ECG.  Date: 08/02/2020 EKG Time: 0029 Rate: 67 Rhythm: normal sinus rhythm QRS Axis: normal Intervals: normal ST/T Wave abnormalities: normal Narrative Interpretation: no evidence of acute ischemia  ____________________________________________  RADIOLOGY  ED MD interpretation: CT of the abdomen and pelvis with IV contrast shows evidence of a possible partial/developing small bowel obstruction with fluid-filled and dilated loops of bowel in the right lower quadrant with a transition point at the level of the right inguinal hernia that has fat and bowel contained in it with some thickened appearance of the bowel with adjacent fluid in the inguinal canal concerning for incarceration.  No evidence of free air to suggest acute perforation. Official radiology report(s): CT Abdomen Pelvis W Contrast  Result Date: 08/02/2020 CLINICAL DATA:  Epigastric pain, history of chronic pancreatitis EXAM: CT ABDOMEN AND PELVIS WITH CONTRAST TECHNIQUE: Multidetector CT imaging of the abdomen and pelvis was performed using the standard protocol following bolus administration of intravenous contrast. CONTRAST:  100mL OMNIPAQUE IOHEXOL 300 MG/ML  SOLN COMPARISON:  CT 12/09/2018, MRI 09/23/2019 FINDINGS: Lower chest: Lung bases are clear. Normal heart size. No pericardial effusion. Calcifications are seen at the mitral annulus. Hepatobiliary: No worrisome focal liver lesions. Smooth liver surface contour. Normal hepatic attenuation. Gallbladder largely decompressed at the time of exam. No biliary ductal dilatation or visible intraductal gallstones. Pancreas: Mild pancreatic atrophy with diffuse pancreatic parenchymal  calcifications. Stable appearance of a 8 mm hypoattenuating focus in the tail of the pancreas. No new worrisome pancreatic lesions. Spleen: Normal in size. No concerning splenic lesions. Adrenals/Urinary Tract: Stable lobular thickening of the adrenal glands without concerning dominant nodule. Kidneys are normally located with symmetric enhancement and excretion. Redemonstration of a 6.1 cm cyst in the upper pole left kidney with a thin peripheral calcification (Bosniak 2) additional tiny subcentimeter hypoattenuating foci are present in both kidneys too small to fully characterize on CT imaging but statistically likely benign. no suspicious renal lesion, urolithiasis or hydronephrosis. Urinary bladder is unremarkable. Stomach/Bowel: Challenging assessment of the bowel given a paucity of intraperitoneal fat. Stomach is moderately distended with ingested fluid and air. No frank small bowel thickening or dilatation is seen. Question some fluid-filled and mildly thickened loops of ileum in the right lower quadrant as well as a short segment of distal small bowel which enters into a right inguinal hernia sac with more distal decompression after exiting the hernia. Moderate to large colonic stool burden is predominantly centered within the right hemicolon including the cecum which is displaced to the midline abdomen. No evidence of frank volvulus. No abnormal colonic thickening or significant distention. Vascular/Lymphatic: Atherosclerotic calcifications within the abdominal aorta and branch vessels. No aneurysm or ectasia. No enlarged abdominopelvic lymph nodes though assessment is limited given the Reproductive: Extensive dystrophic calcification of the enlarged prostate is similar to prior. No concerning focal lesion or seminal vesicle abnormality. Right inguinal hernia, as above with small amount of fluid in the inguinal canal as well. Other: Fat and bowel containing right inguinal hernia excluded well with thickened  appearance of the bowel contents with some adjacent fluid in the inguinal canal. A degree of strangulation may be present. No other bowel containing hernia. Low-attenuation ascites is seen elsewhere throughout the abdomen, nonspecific. No free air to suggest a frank perforation at this time. Musculoskeletal: Multilevel discogenic and facet degenerative changes are present throughout the lumbar spine  including a focal reversal of the lumbar lordosis at L4-5 where these changes are most severe. Unchanged grade 1 anterolisthesis L3 on 4. Additional abrupt angulation of the sacrum, possibly remote posttraumatic, similar to comparison. No acute or worrisome osseous lesion is seen. IMPRESSION: 1. Possible partial/developing small bowel obstruction with fluid-filled small bowel loops in the right lower quadrant and a transition point at the level of a fat and bowel containing right inguinal hernia excluded well with thickened appearance of the bowel contents with some adjacent fluid in the inguinal canal. A degree of strangulation may be present. Decompression after exiting the hernia. 2. Moderate to large colonic stool burden is predominantly centered within the right hemicolon including the cecum. 3. Low-attenuation ascites is seen elsewhere throughout the abdomen, nonspecific. No free air to suggest a frank perforation at this time. 4. Stable 8 mm hypoattenuating focus in the tail of the pancreas, previously characterized as a benign pseudocyst. 5. Chronic calcific pancreatitis. 6. Stable 6.1 cm cyst in the upper pole left kidney with a thin peripheral calcification (Bosniak 2). 7. Aortic Atherosclerosis (ICD10-I70.0). Electronically Signed   By: Kreg Shropshire M.D.   On: 08/02/2020 02:03    ____________________________________________   PROCEDURES  Procedure(s) performed (including Critical Care):  .1-3 Lead EKG Interpretation Performed by: Merwyn Katos, MD Authorized by: Merwyn Katos, MD      Interpretation: normal     ECG rate:  57   ECG rate assessment: normal     Rhythm: sinus rhythm     Ectopy: none     Conduction: normal       ____________________________________________   INITIAL IMPRESSION / ASSESSMENT AND PLAN / ED COURSE  As part of my medical decision making, I reviewed the following data within the electronic MEDICAL RECORD NUMBER Nursing notes reviewed and incorporated, Labs reviewed, EKG interpreted, Old chart reviewed, Radiograph reviewed and Notes from prior ED visits reviewed and incorporated        Patient is a 69 year old male with the above-stated past medical history presents for worsening abdominal pain Differential diagnosis includes but is not limited to: Small bowel obstruction, incarcerated/strangulated inguinal hernia, acute on chronic pancreatitis, perforated hollow viscus CT scan shows as above with concerns for SBO with incarcerated right inguinal hernia. On exam patient's hernia is completely reducible with minimal pain.  Patient's pain is significantly reduced after analgesia as well as reducing this hernia.  Patient denies any further nausea I spoke to the on-call general surgeon who recommended admission but did not feel this case warranted emergent surgical repair as the hernia is reducible.  As patient's nausea has resolved, patient will not need an NG tube at this time.  I spoke to the on-call internal medicine physician who agrees to accept this patient onto his service for full general surgical evaluation in the morning      ____________________________________________   FINAL CLINICAL IMPRESSION(S) / ED DIAGNOSES  Final diagnoses:  Lower abdominal pain  Nausea  Incarcerated right inguinal hernia     ED Discharge Orders    None       Note:  This document was prepared using Dragon voice recognition software and may include unintentional dictation errors.   Merwyn Katos, MD 08/02/20 [phone]

## 2020-08-02 NOTE — ED Notes (Signed)
Patient stated that he no longer wanted to stay due that he's uncomfortable. AMA signed by patient.

## 2020-08-02 NOTE — ED Notes (Signed)
Lab called to obtain blood cultures.

## 2020-08-02 NOTE — Progress Notes (Signed)
69 y/o M presenting with epigastric abdominal pain, found to have large inguinal hernia containing fat and bowel.  ED provider able to easily reduce hernia after administering pain medication.  Despite CT read questioning possible SBO at the site, patient without N/V and pain markedly improved.  Currently being resuscitated.  No indication for emergent surgical intervention.  Full consult to follow in the AM.

## 2020-08-02 NOTE — H&P (Signed)
[place]   PATIENT NAME: Tom Pace    MR#:  [id number]  DATE OF BIRTH:  [birth year]  DATE OF ADMISSION:  08/02/2020  PRIMARY CARE PHYSICIAN: Mick Sell, MD   REQUESTING/REFERRING PHYSICIAN: Nida Boatman letter, Clent Jacks, MD  CHIEF COMPLAINT:   Chief Complaint  Patient presents with  . Abdominal Pain    HISTORY OF PRESENT ILLNESS:  Tom Pace  is a 69 y.o. male with a known history of hypertension, pancreatitis, seizures, peripheral neuropathy and hepatitis C, who presented to the emergency room with acute onset of severe lower abdominal pain.  He stated that it was the worst thing that he experienced in his life.  He has a history of right larger than left inguinal hernia.  His right sided hernia has been giving him trouble for a while however he is always been able to reduce it.  He admitted to nausea that he mainly relates to pain medications.  He denies any nausea or vomiting at home.  No fever or chills.  No dysuria, oliguria or hematuria or flank pain.  No chest pain or dyspnea or cough.  No bleeding diathesis.EKG showed sinus rhythm with rate of 67 with left atrial enlargement and mild intraventricular conduction delay with prolonged QT interval (QTC 496 MS).  Upon presentation to the emergency room, blood pressure was 152/132 and later 160/93 with a respiratory to 24 and later 19.  Labs revealed mild hypokalemia and a glucose of 186.  High-sensitivity troponin I was 507.  Left testicle 3.1 later 2.5.  CBC showed leukocytosis 17.8 with mild anemia.  Influenza antigens and COVID-19 PCR came back negative.  Alcohol levels 1 less than 10.  Abdominal and pelvic CT scan revealed the following: 1. Possible partial/developing small bowel obstruction with fluid-filled small bowel loops in the right lower quadrant and a transition point at the level of a fat and bowel containing right inguinal hernia excluded well with thickened appearance of the bowel contents with some  adjacent fluid in the inguinal canal. A degree of strangulation may be present. Decompression after exiting the hernia. 2. Moderate to large colonic stool burden is predominantly centered within the right hemicolon including the cecum. 3. Low-attenuation ascites is seen elsewhere throughout the abdomen, nonspecific. No free air to suggest a frank perforation at this time. 4. Stable 8 mm hypoattenuating focus in the tail of the pancreas, previously characterized as a benign pseudocyst. 5. Chronic calcific pancreatitis. 6. Stable 6.1 cm cyst in the upper pole left kidney with a thin peripheral calcification (Bosniak 2). 7. Aortic Atherosclerosis  The patient was given 1 L of IV lactated Ringer, 4 mg of IV morphine sulfate and 4 mg IV Zofran as well as 1 L IV normal saline and Toradol 30 mg IV.  The ER physician was able to reduce the patient's right inguinal hernia.  He was feeling much more comfortable.  He was not having any nausea or vomiting.  Dr. Lady Gary was notified about the patient.  She saw no indication for emergent surgical intervention and the patient will be evaluated in a.m.  Given lack of nausea and vomiting NG tube was not deemed necessary at this time.  The patient will be admitted to a med-surgical bed for further evaluation and management. PAST MEDICAL HISTORY:   Past Medical History:  Diagnosis Date  . Hepatitis C   . Hypertension   . Neuropathy   . Pancreatitis   . Seizures (HCC)    stated  had a seizure after he took lyrica     PAST SURGICAL HISTORY:   Past Surgical History:  Procedure Laterality Date  . NO PAST SURGERIES      SOCIAL HISTORY:   Social History   Tobacco Use  . Smoking status: Current Some Day Smoker    Packs/day: 1.50    Years: 49.00    Pack years: 73.50    Types: Cigarettes    Last attempt to quit: 2019    Years since quitting: 2.8  . Smokeless tobacco: Never Used  Substance Use Topics  . Alcohol use: Not Currently    FAMILY  HISTORY:   Family History  Problem Relation Age of Onset  . Diabetes Mother   . Osteoporosis Mother   . Suicidality Father     DRUG ALLERGIES:   Allergies  Allergen Reactions  . Pregabalin Other (See Comments)    "Violent seizures"    REVIEW OF SYSTEMS:   ROS As per history of present illness. All pertinent systems were reviewed above. Constitutional, HEENT, cardiovascular, respiratory, GI, GU, musculoskeletal, neuro, psychiatric, endocrine, integumentary and hematologic systems were reviewed and are otherwise negative/unremarkable except for positive findings mentioned above in the HPI.   MEDICATIONS AT HOME:   Prior to Admission medications   Medication Sig Start Date End Date Taking? Authorizing Provider  CREON 36000-114000 units CPEP capsule Take 2 capsules by mouth See admin instructions. Take 2 capsule by mouth 3 times daily with meals and 1 capsule by mouth with snack. 12/11/18  Yes [provider]  Acetylcarnitine HCl (ACETYL L-CARNITINE PO) Take 600 mg by mouth daily. Patient not taking: Reported on 08/02/2020    [provider]  Alpha-Lipoic Acid 300 MG TABS Take 300 mg by mouth in the morning and at bedtime. Patient not taking: Reported on 08/02/2020    [provider]  Arginine 2000 MG PACK Take 2 g by mouth in the morning and at bedtime. Patient not taking: Reported on 08/02/2020    [provider]  ASTAXANTHIN PO Take 10 mg by mouth daily. Patient not taking: Reported on 08/02/2020    [provider]  b complex vitamins tablet Take 1 tablet by mouth daily. Patient not taking: Reported on 08/02/2020    [provider]  Bioflavonoid Products (ESTER C PO) Take 1,000 mg by mouth in the morning and at bedtime. Patient not taking: Reported on 08/02/2020    [provider]  BORON PO Take 5 mg by mouth in the morning and at bedtime. Patient not taking: Reported on 08/02/2020    [provider]  Boswellia  Serrata (BOSWELLIA PO) Take 800 mg by mouth in the morning and at bedtime. Patient not taking: Reported on 08/02/2020    [provider]  Cats Claw 400 MG CAPS Take 400 mg by mouth daily. Patient not taking: Reported on 08/02/2020    [provider]  Cholecalciferol (VITAMIN D) 125 MCG (5000 UT) CAPS Take 5,000 Units by mouth daily. Patient not taking: Reported on 08/02/2020    [provider]  Citrulline (CITRULLINE 1000) 1 g PACK Take 2 g by mouth in the morning and at bedtime. Patient not taking: Reported on 08/02/2020    [provider]  COLLAGEN PO Take 1 g by mouth in the morning and at bedtime. Patient not taking: Reported on 08/02/2020    [provider]  famotidine (PEPCID) 20 MG tablet Take 1 tablet (20 mg total) by mouth at bedtime. Patient not taking:  Reported on 05/20/2020 12/09/18 12/09/19  Nita Sickle, MD  Glecaprevir-Pibrentasvir (MAVYRET PO) Take by mouth. Patient not taking: Reported on 05/20/2020    [provider]  levOCARNitine (CARNITINE, L,) POWD Take 500 mg by mouth daily. Patient not taking: Reported on 08/02/2020    [provider]  lisinopril (ZESTRIL) 10 MG tablet TK 1 T PO QAM Patient not taking: Reported on 03/17/2020 04/20/19   [provider]  Maca 500 MG CAPS Take 500 mg by mouth in the morning, at noon, and at bedtime. Patient not taking: Reported on 08/02/2020    [provider]  MAGNESIUM CITRATE PO Take 2,500 mg by mouth in the morning and at bedtime. Patient not taking: Reported on 08/02/2020    [provider]  Methylcobalamin (METHYL B-12 PO) Take 1,000 mg by mouth in the morning and at bedtime. Patient not taking: Reported on 08/02/2020    [provider]  MILK THISTLE EXTRACT PO Take 1 capsule by mouth in the morning and at bedtime. Patient not taking: Reported on 08/02/2020    [provider]  Misc Natural Products (BETA-SITOSTEROL PLANT STEROLS) CAPS  Take 1 capsule by mouth daily. Patient not taking: Reported on 08/02/2020    [provider]  Multiple Vitamin (MULTIVITAMIN WITH MINERALS) TABS tablet Take 1 tablet by mouth daily. Patient not taking: Reported on 08/02/2020    [provider]  Multiple Vitamins-Minerals (PRESERVISION AREDS 2 PO) Take 1 capsule by mouth in the morning and at bedtime. Patient not taking: Reported on 08/02/2020    [provider]  Nettle, Urtica Dioica, (NETTLE LEAF) 435 MG CAPS Take by mouth. Patient not taking: Reported on 08/02/2020    [provider]  ondansetron (ZOFRAN ODT) 4 MG disintegrating tablet Take 1 tablet (4 mg total) by mouth every 8 (eight) hours as needed. Patient not taking: Reported on 07/23/2019 12/11/18   Rockne Menghini, MD  OVER THE COUNTER MEDICATION Take 1 capsule by mouth in the morning and at bedtime. Adaptogenic Complex Patient not taking: Reported on 08/02/2020    [provider]  OVER THE COUNTER MEDICATION Take 500 mg by mouth in the morning, at noon, and at bedtime. D Ribose Patient not taking: Reported on 08/02/2020    [provider]  oxymetazoline (AFRIN) 0.05 % nasal spray Place 1 spray into both nostrils 2 (two) times daily as needed for congestion.  Patient not taking: Reported on 08/02/2020    [provider]  pantoprazole (PROTONIX) 40 MG tablet  03/24/19   [provider]  Potassium Citrate 99 MG CAPS Take 99 mg by mouth daily. Patient not taking: Reported on 08/02/2020    [provider]  RESVERATROL PO Take 500-1,000 mg by mouth See admin instructions. Take 1000 mg by mouth in the morning and 500 mg at night Patient not taking: Reported on 08/02/2020    [provider]  rOPINIRole (REQUIP) 0.25 MG tablet  03/30/19   [provider]  RUTIN PO Take 1 tablet by mouth in the morning and at bedtime. Patient not taking: Reported on 08/02/2020    [provider]  Sodium  Hyaluronate, oral, (HYALURONIC ACID) 100 MG CAPS Take 250 mg by mouth every morning. Patient not taking: Reported on 08/02/2020    [provider]  Thiamine Mononitrate (B1 PO) Take 300 mg by mouth daily. Patient not taking: Reported on 08/02/2020    [provider]  traMADol-acetaminophen (ULTRACET) 37.5-325 MG tablet TK 1 T PO Q 6  H Patient not taking: Reported on 03/17/2020 02/25/19   [provider]  Zinc Picolinate 25 MG TABS Take 25 mg by mouth daily. Patient not taking: Reported on 08/02/2020    [provider]      VITAL SIGNS:  Blood pressure (!) 143/79, pulse (!) 57, temperature 98 F (36.7 C), temperature source Oral, resp. rate 18, SpO2 95 %.  PHYSICAL EXAMINATION:  Physical Exam  GENERAL:  69 y.o.-year-old Caucasian male patient lying in the bed with no acute distress.  EYES: Pupils equal, round, reactive to light and accommodation. No scleral icterus. Extraocular muscles intact.  HEENT: Head atraumatic, normocephalic. Oropharynx and nasopharynx clear.  NECK:  Supple, no jugular venous distention. No thyroid enlargement, no tenderness.  LUNGS: Normal breath sounds bilaterally, no wheezing, rales,rhonchi or crepitation. No use of accessory muscles of respiration.  CARDIOVASCULAR: Regular rate and rhythm, S1, S2 normal. No murmurs, rubs, or gallops.  ABDOMEN: Soft, nondistended, nontender. Bowel sounds present.  He has a palpable small right inguinal hernia that is nontender.  No organomegaly or mass.  EXTREMITIES: No pedal edema, cyanosis, or clubbing.  NEUROLOGIC: Cranial nerves II through XII are intact. Muscle strength 5/5 in all extremities. Sensation intact. Gait not checked.  PSYCHIATRIC: The patient is alert and oriented x 3.  Normal affect and good eye contact. SKIN: No obvious rash, lesion, or ulcer.   LABORATORY PANEL:   CBC Recent Labs  Lab 08/02/20 0029  WBC 17.8*  HGB 13.1  HCT 38.2*  PLT 225    ------------------------------------------------------------------------------------------------------------------  Chemistries  Recent Labs  Lab 08/02/20 0029  NA 140  K 3.2*  CL 105  CO2 22  GLUCOSE 186*  BUN 23  CREATININE 0.86  CALCIUM 9.5  AST 28  ALT 15  ALKPHOS 72  BILITOT 1.0   ------------------------------------------------------------------------------------------------------------------  Cardiac Enzymes No results for input(s): TROPONINI in the last 168 hours. ------------------------------------------------------------------------------------------------------------------  RADIOLOGY:  CT Abdomen Pelvis W Contrast  Result Date: 08/02/2020 CLINICAL DATA:  Epigastric pain, history of chronic pancreatitis EXAM: CT ABDOMEN AND PELVIS WITH CONTRAST TECHNIQUE: Multidetector CT imaging of the abdomen and pelvis was performed using the standard protocol following bolus administration of intravenous contrast. CONTRAST:  100mL OMNIPAQUE IOHEXOL 300 MG/ML  SOLN COMPARISON:  CT 12/09/2018, MRI 09/23/2019 FINDINGS: Lower chest: Lung bases are clear. Normal heart size. No pericardial effusion. Calcifications are seen at the mitral annulus. Hepatobiliary: No worrisome focal liver lesions. Smooth liver surface contour. Normal hepatic attenuation. Gallbladder largely decompressed at the time of exam. No biliary ductal dilatation or visible intraductal gallstones. Pancreas: Mild pancreatic atrophy with diffuse pancreatic parenchymal calcifications. Stable appearance of a 8 mm hypoattenuating focus in the tail of the pancreas. No new worrisome pancreatic lesions. Spleen: Normal in size. No concerning splenic lesions. Adrenals/Urinary Tract: Stable lobular thickening of the adrenal glands without concerning dominant nodule. Kidneys are normally located with symmetric enhancement and excretion. Redemonstration of a 6.1 cm cyst in the upper pole left kidney with a thin peripheral calcification  (Bosniak 2) additional tiny subcentimeter hypoattenuating foci are present in both kidneys too small to fully characterize on CT imaging but statistically likely benign. no suspicious renal lesion, urolithiasis or hydronephrosis. Urinary bladder is unremarkable. Stomach/Bowel: Challenging assessment of the bowel given a paucity of intraperitoneal fat. Stomach is moderately distended with ingested fluid and air. No frank small bowel thickening or dilatation is seen. Question some fluid-filled and mildly thickened loops of ileum in the right lower quadrant as well as a short segment  of distal small bowel which enters into a right inguinal hernia sac with more distal decompression after exiting the hernia. Moderate to large colonic stool burden is predominantly centered within the right hemicolon including the cecum which is displaced to the midline abdomen. No evidence of frank volvulus. No abnormal colonic thickening or significant distention. Vascular/Lymphatic: Atherosclerotic calcifications within the abdominal aorta and branch vessels. No aneurysm or ectasia. No enlarged abdominopelvic lymph nodes though assessment is limited given the Reproductive: Extensive dystrophic calcification of the enlarged prostate is similar to prior. No concerning focal lesion or seminal vesicle abnormality. Right inguinal hernia, as above with small amount of fluid in the inguinal canal as well. Other: Fat and bowel containing right inguinal hernia excluded well with thickened appearance of the bowel contents with some adjacent fluid in the inguinal canal. A degree of strangulation may be present. No other bowel containing hernia. Low-attenuation ascites is seen elsewhere throughout the abdomen, nonspecific. No free air to suggest a frank perforation at this time. Musculoskeletal: Multilevel discogenic and facet degenerative changes are present throughout the lumbar spine including a focal reversal of the lumbar lordosis at L4-5  where these changes are most severe. Unchanged grade 1 anterolisthesis L3 on 4. Additional abrupt angulation of the sacrum, possibly remote posttraumatic, similar to comparison. No acute or worrisome osseous lesion is seen. IMPRESSION: 1. Possible partial/developing small bowel obstruction with fluid-filled small bowel loops in the right lower quadrant and a transition point at the level of a fat and bowel containing right inguinal hernia excluded well with thickened appearance of the bowel contents with some adjacent fluid in the inguinal canal. A degree of strangulation may be present. Decompression after exiting the hernia. 2. Moderate to large colonic stool burden is predominantly centered within the right hemicolon including the cecum. 3. Low-attenuation ascites is seen elsewhere throughout the abdomen, nonspecific. No free air to suggest a frank perforation at this time. 4. Stable 8 mm hypoattenuating focus in the tail of the pancreas, previously characterized as a benign pseudocyst. 5. Chronic calcific pancreatitis. 6. Stable 6.1 cm cyst in the upper pole left kidney with a thin peripheral calcification (Bosniak 2). 7. Aortic Atherosclerosis (ICD10-I70.0). Electronically Signed   By: Kreg Shropshire M.D.   On: 08/02/2020 02:03      IMPRESSION AND PLAN:   1.  Symptomatic right inguinal hernia with questionable partial small bowel obstruction and concern of incarceration on abdominal and pelvic CT scan, mostly reduced with pain medications with no current pain. -The patient will be admitted to a med-surgical bed. -Pain management will be provided. -He will be monitored for recurrence. -The patient will be kept n.p.o. -A general surgery consult will be obtained. -Dr. Lady Gary is aware about the patient as mentioned above.  2.  Hypokalemia. -Potassium will be replaced and magnesium level will be checked.  3.  Elevated lactic acid.  This could have been related to mild reversible ischemia. -This has  been improving with hydration and pain medications.  4.  Leukocytosis. -This like secondary to stress demargination.  The patient is currently afebrile. -Given the possibility of UTI however we will place him on IV Rocephin and will follow urine culture and sensitivity.  5.  DVT prophylaxis. -SCDs. -Medical prophylaxis currently held off for the possibility of surgical intervention..    All the records are reviewed and case discussed with ED provider. The plan of care was discussed in details with the patient (and family). I answered all questions. The patient agreed to  proceed with the above mentioned plan. Further management will depend upon hospital course.   CODE STATUS: Full code  Status is: Inpatient  Remains inpatient appropriate because:Ongoing diagnostic testing needed not appropriate for outpatient work up, Unsafe d/c plan, IV treatments appropriate due to intensity of illness or inability to take PO and Inpatient level of care appropriate due to severity of illness   Dispo: The patient is from: Home              Anticipated d/c is to: Home              Anticipated d/c date is: 2 days              Patient currently is not medically stable to d/c.   TOTAL TIME TAKING CARE OF THIS PATIENT: 55 minutes.    Hannah Beat M.D on 08/02/2020 at 5:34 AM  [facility]   From 7 PM-7 AM, contact night-coverage [email or web address]  CC: Primary care physician; Mick Sell, MD

## 2020-08-02 NOTE — Consult Note (Addendum)
[facility] SURGICAL CONSULTATION NOTE (initial) - cpt: 16109: [id number] (Outpatient/ED)   HISTORY OF PRESENT ILLNESS (HPI):  69 y.o. male presented to [facility] ED today for evaluation of abdominal pain. Patient reports the acute onset of periumbilical abdominal pain around 6 hours prior to presentation to the ED. The pain was sharp in nature and he rated this a 10/10. This did not radiate. He notes associated nausea with the pain. No fever, chills, cough, CP, SOB, emesis, urinary changes, or bowel changes. He has a history of pancreatitis but this pain is not similar to that. No previous intra-abdominal surgeries reported. He does note a history of right inguinal hernia for "multiple years." This typically stays out but he can always reduce it fairly easily when laying flat. He was seen by Dr Hazle Quantintron-Diaz for this in August of this year and was scheduled for surgery but cancelled secondary to "personal problems." He never rescheduled. Work up in the ED revealed normal renal function (sCr - 0.86), mild hypokalemia to 3.2, normal lipase at 28, leukocytosis to 17.8k (now 13.5K), initial lactic acidosis to 3.2 which is improved with IVF resuscitation (down to 2.5), and CT Abdomen/Pelvis was concerning for right inguinal hernia with potentially developing SBO. The EDP overnight was able to reduce his right inguinal hernia easily. He was admitted to medicine service.   Surgery is consulted by emergency medicine physician Dr. Donna BernardEvan Bradler, MD in this context for evaluation and management of right inguinal hernia and possible early associated SBO.  This morning, he reports that he is feeling much better and no longer having pian. He has since had a large bowel movement as well. His right inguinal hernia remains reduced this morning. He is anxious to eat and go home.    PAST MEDICAL HISTORY (PMH):  Past Medical History:  Diagnosis Date  . Hepatitis C   . Hypertension   . Neuropathy   . Pancreatitis   .  Seizures (HCC)    stated had a seizure after he took lyrica      PAST SURGICAL HISTORY (PSH):  Past Surgical History:  Procedure Laterality Date  . NO PAST SURGERIES       MEDICATIONS:  Prior to Admission medications   Medication Sig Start Date End Date Taking? Authorizing Provider  CREON 36000-114000 units CPEP capsule Take 2 capsules by mouth See admin instructions. Take 2 capsule by mouth 3 times daily with meals and 1 capsule by mouth with snack. 12/11/18  Yes [provider]  Acetylcarnitine HCl (ACETYL L-CARNITINE PO) Take 600 mg by mouth daily. Patient not taking: Reported on 08/02/2020    [provider]  Alpha-Lipoic Acid 300 MG TABS Take 300 mg by mouth in the morning and at bedtime. Patient not taking: Reported on 08/02/2020    [provider]  Arginine 2000 MG PACK Take 2 g by mouth in the morning and at bedtime. Patient not taking: Reported on 08/02/2020    [provider]  ASTAXANTHIN PO Take 10 mg by mouth daily. Patient not taking: Reported on 08/02/2020    [provider]  b complex vitamins tablet Take 1 tablet by mouth daily. Patient not taking: Reported on 08/02/2020    [provider]  Bioflavonoid Products (ESTER C PO) Take 1,000 mg by mouth in the morning and at bedtime. Patient not taking: Reported on 08/02/2020    [provider]  BORON PO Take 5 mg by mouth in the morning and at bedtime. Patient not taking: Reported  on 08/02/2020    [provider]  Boswellia Serrata (BOSWELLIA PO) Take 800 mg by mouth in the morning and at bedtime. Patient not taking: Reported on 08/02/2020    [provider]  Cats Claw 400 MG CAPS Take 400 mg by mouth daily. Patient not taking: Reported on 08/02/2020    [provider]  Cholecalciferol (VITAMIN D) 125 MCG (5000 UT) CAPS Take 5,000 Units by mouth daily. Patient not taking: Reported on 08/02/2020    [provider]  Citrulline  (CITRULLINE 1000) 1 g PACK Take 2 g by mouth in the morning and at bedtime. Patient not taking: Reported on 08/02/2020    [provider]  COLLAGEN PO Take 1 g by mouth in the morning and at bedtime. Patient not taking: Reported on 08/02/2020    [provider]  famotidine (PEPCID) 20 MG tablet Take 1 tablet (20 mg total) by mouth at bedtime. Patient not taking: Reported on 05/20/2020 12/09/18 12/09/19  Nita Sickle, MD  Glecaprevir-Pibrentasvir (MAVYRET PO) Take by mouth. Patient not taking: Reported on 05/20/2020    [provider]  levOCARNitine (CARNITINE, L,) POWD Take 500 mg by mouth daily. Patient not taking: Reported on 08/02/2020    [provider]  lisinopril (ZESTRIL) 10 MG tablet TK 1 T PO QAM Patient not taking: Reported on 03/17/2020 04/20/19   [provider]  Maca 500 MG CAPS Take 500 mg by mouth in the morning, at noon, and at bedtime. Patient not taking: Reported on 08/02/2020    [provider]  MAGNESIUM CITRATE PO Take 2,500 mg by mouth in the morning and at bedtime. Patient not taking: Reported on 08/02/2020    [provider]  Methylcobalamin (METHYL B-12 PO) Take 1,000 mg by mouth in the morning and at bedtime. Patient not taking: Reported on 08/02/2020    [provider]  MILK THISTLE EXTRACT PO Take 1 capsule by mouth in the morning and at bedtime. Patient not taking: Reported on 08/02/2020    [provider]  Misc Natural Products (BETA-SITOSTEROL PLANT STEROLS) CAPS Take 1 capsule by mouth daily. Patient not taking: Reported on 08/02/2020    [provider]  Multiple Vitamin (MULTIVITAMIN WITH MINERALS) TABS tablet Take 1 tablet by mouth daily. Patient not taking: Reported on 08/02/2020    [provider]  Multiple Vitamins-Minerals (PRESERVISION AREDS 2 PO) Take 1 capsule by mouth in the morning and at bedtime. Patient not taking: Reported on 08/02/2020    [provider]  Nettle, Urtica Dioica, (NETTLE LEAF) 435 MG CAPS Take by mouth. Patient not taking: Reported on 08/02/2020    [provider]  ondansetron (ZOFRAN ODT) 4 MG disintegrating tablet Take 1 tablet (4 mg total) by mouth every 8 (eight) hours as needed. Patient not taking: Reported on 07/23/2019 12/11/18   Rockne Menghini, MD  OVER THE COUNTER MEDICATION Take 1 capsule by mouth in the morning and at bedtime. Adaptogenic Complex Patient not taking: Reported on 08/02/2020    [provider]  OVER THE COUNTER MEDICATION Take 500 mg by mouth in the morning, at noon, and at bedtime. D Ribose Patient not taking: Reported on 08/02/2020    [provider]  oxymetazoline (AFRIN) 0.05 % nasal spray Place 1 spray into both nostrils 2 (two) times daily as needed for congestion.  Patient not taking: Reported on 08/02/2020    [provider]  pantoprazole (PROTONIX) 40 MG tablet  03/24/19   [provider]  Potassium Citrate 99 MG CAPS Take 99 mg by mouth daily. Patient not taking: Reported on 08/02/2020    [provider]  RESVERATROL PO Take 500-1,000 mg by mouth See admin instructions. Take 1000 mg by mouth in the morning and 500 mg at night Patient not taking: Reported on 08/02/2020    [provider]  rOPINIRole (REQUIP) 0.25 MG tablet  03/30/19   [provider]  RUTIN PO Take 1 tablet by mouth in the morning and at bedtime. Patient not taking: Reported on 08/02/2020    [provider]  Sodium Hyaluronate, oral, (HYALURONIC ACID) 100 MG CAPS Take 250 mg by mouth every morning. Patient not taking: Reported on 08/02/2020    [provider]  Thiamine Mononitrate (B1 PO) Take 300 mg by mouth daily. Patient not taking: Reported on 08/02/2020    [provider]  traMADol-acetaminophen (ULTRACET) 37.5-325 MG tablet TK 1 T PO Q 6 H Patient not taking: Reported on 03/17/2020 02/25/19   [provider]   Zinc Picolinate 25 MG TABS Take 25 mg by mouth daily. Patient not taking: Reported on 08/02/2020    [provider]     ALLERGIES:  Allergies  Allergen Reactions  . Pregabalin Other (See Comments)    "Violent seizures"     SOCIAL HISTORY:  Social History   Socioeconomic History  . Marital status: Single    Spouse name: Not on file  . Number of children: Not on file  . Years of education: Not on file  . Highest education level: Not on file  Occupational History  . Not on file  Tobacco Use  . Smoking status: Current Some Day Smoker    Packs/day: 1.50    Years: 49.00    Pack years: 73.50    Types: Cigarettes    Last attempt to quit: 2019    Years since quitting: 2.8  . Smokeless tobacco: Never Used  Vaping Use  . Vaping Use: Never used  Substance and Sexual Activity  . Alcohol use: Not Currently  . Drug use: Not Currently    Types: Marijuana  . Sexual activity: Not on file  Other Topics Concern  . Not on file  Social History Narrative  . Not on file   Social Determinants of Health   Financial Resource Strain:   . Difficulty of Paying Living Expenses: Not on file  Food Insecurity:   . Worried About Programme researcher, broadcasting/film/video in the Last Year: Not on file  . Ran Out of Food in the Last Year: Not on file  Transportation Needs:   . Lack of Transportation (Medical): Not on file  . Lack of Transportation (Non-Medical): Not on file  Physical Activity:   . Days of Exercise per Week: Not on file  . Minutes of Exercise per Session: Not on file  Stress:   . Feeling of Stress : Not on file  Social Connections:   . Frequency of Communication with Friends and Family: Not on file  . Frequency of Social Gatherings with Friends and Family: Not on file  . Attends Religious Services: Not on file  . Active Member of Clubs or Organizations: Not on file  . Attends Banker Meetings: Not on file  . Marital Status: Not on file  Intimate Partner Violence:   . Fear  of Current or Ex-Partner: Not on file  . Emotionally Abused: Not on file  . Physically Abused: Not on file  . Sexually Abused: Not on  file     FAMILY HISTORY:  Family History  Problem Relation Age of Onset  . Diabetes Mother   . Osteoporosis Mother   . Suicidality Father       REVIEW OF SYSTEMS:  Review of Systems  Constitutional: Negative for chills and fever.  HENT: Negative for congestion and sore throat.   Respiratory: Negative for cough and shortness of breath.   Cardiovascular: Negative for chest pain and palpitations.  Gastrointestinal: Positive for abdominal pain and nausea. Negative for blood in stool, constipation, diarrhea, melena and vomiting.  Genitourinary: Negative for dysuria and urgency.  All other systems reviewed and are negative.   VITAL SIGNS:  Temp:  [98 F (36.7 C)] 98 F (36.7 C) (11/09 0053) Pulse Rate:  [47-80] 50 (11/09 0600) Resp:  [10-24] 11 (11/09 0600) BP: (117-160)/(51-132) 130/70 (11/09 0600) SpO2:  [95 %-100 %] 96 % (11/09 0600)             INTAKE/OUTPUT:  11/08 0701 - 11/09 0700 In: 2000 [IV Piggyback:2000] Out: -   PHYSICAL EXAM:  Physical Exam Vitals and nursing note reviewed.  Constitutional:      General: He is not in acute distress.    Appearance: He is well-developed and normal weight. He is not ill-appearing.  HENT:     Head: Normocephalic and atraumatic.     Mouth/Throat:     Mouth: Mucous membranes are moist.     Pharynx: Oropharynx is clear.     Comments: edentulous Cardiovascular:     Rate and Rhythm: Regular rhythm. Bradycardia present.     Heart sounds: Normal heart sounds. No murmur heard.   Pulmonary:     Effort: Pulmonary effort is normal. No respiratory distress.     Breath sounds: Normal breath sounds.  Abdominal:     General: Abdomen is flat. There is no distension.     Palpations: Abdomen is soft.     Tenderness: There is no abdominal tenderness. There is no guarding or rebound.     Hernia: A hernia  is present. Hernia is present in the right inguinal area (Easily reducible).     Comments: Abdomen is soft, non-tender, non-distended, no rebound/guarding. Right inguinal hernia palpable, this is soft, easily reducible   Genitourinary:    Comments: Deferred Skin:    General: Skin is warm and dry.     Coloration: Skin is not jaundiced or pale.  Neurological:     General: No focal deficit present.     Mental Status: He is alert and oriented to person, place, and time.  Psychiatric:        Mood and Affect: Mood normal.        Behavior: Behavior normal.      Labs:  CBC Latest Ref Rng & Units 08/02/2020 12/11/2018 12/09/2018  WBC 4.0 - 10.5 K/uL 17.8(H) 15.1(H) 15.3(H)  Hemoglobin 13.0 - 17.0 g/dL 16.1 09.6 04.5  Hematocrit 39 - 52 % 38.2(L) 44.7 45.0  Platelets 150 - 400 K/uL 225 258 261   CMP Latest Ref Rng & Units 08/02/2020 12/11/2018 12/09/2018  Glucose 70 - 99 mg/dL 409(W) 98 119(J)  BUN 8 - 23 mg/dL Creatinine 0.61 - 1.24 mg/dL 4.78 2.95 6.21  Sodium 135 - 145 mmol/L 140 140 137  Potassium 3.5 - 5.1 mmol/L 3.2(L) 3.6 3.5  Chloride 98 - 111 mmol/L 105 107 105  CO2 22 - 32 mmol/L Calcium 8.9 - 10.3 mg/dL 9.5 9.5 9.3  Total Protein 6.5 - 8.1 g/dL 7.6 8.0 7.7  Total Bilirubin 0.3 - 1.2 mg/dL 1.0 1.0 0.7  Alkaline Phos 38 - 126 U/L 72 61 61  AST 15 - 41 U/L 28 22 19   ALT 0 - 44 U/L 15 25 22      Imaging studies:   CT Abdomen/Pelvis (08/02/2020) personally reviewed showing right inguinal hernia containing loop of small bowel, some associated small bowel dilation, significant colonic stool burden, pancreatic calcifications, and radiologist report reviewed: IMPRESSION: 1. Possible partial/developing small bowel obstruction with fluid-filled small bowel loops in the right lower quadrant and a transition point at the level of a fat and bowel containing right inguinal hernia excluded well with thickened appearance of the bowel contents with some adjacent fluid in  the inguinal canal. A degree of strangulation may be present. Decompression after exiting the hernia. 2. Moderate to large colonic stool burden is predominantly centered within the right hemicolon including the cecum. 3. Low-attenuation ascites is seen elsewhere throughout the abdomen, nonspecific. No free air to suggest a frank perforation at this time. 4. Stable 8 mm hypoattenuating focus in the tail of the pancreas, previously characterized as a benign pseudocyst. 5. Chronic calcific pancreatitis. 6. Stable 6.1 cm cyst in the upper pole left kidney with a thin peripheral calcification (Bosniak 2). 7. Aortic Atherosclerosis (ICD10-I70.0).   Assessment/Plan: (ICD-10's: K40.90) 69 y.o. male with chronic right inguinal hernia which was reduced in the ED without clinical evidence of strangulation or compromise and with return of normal bowel function.    - I do not think this warrants any emergent surgical repair at this time, and he can pursue this in the outpatient setting. He was seen by Dr 13/05/2020 earlier this year for this and had planned surgery but was cancelled. It is reasonable to follow up with him. We will be available to see him in the outpatient setting should he desire. I am okay with PO challenging him today and discharging pending medicine evaluation. I extensively reviewed the signs and symptoms of incarceration and strangulation, and he verbalized understanding.    All of the above findings and recommendations were discussed with the patient, and all of patient's questions were answered to his expressed satisfaction.  Thank you for the opportunity to participate in this patient's care.   -- 73, PA-C [facility] 08/02/2020, 7:17 AM [phone] M-F: 7am - 4pm  The patient had been discharged prior to my being able to personally evalute him. Based upon my review of the chart and my discussion with Mr. 13/05/2020, I concur with his  documentation.

## 2020-08-02 NOTE — ED Notes (Signed)
Lab to collect cultures

## 2020-08-02 NOTE — Progress Notes (Signed)
Brief hospitalist update note This is a nonbillable note.  Please see same-day H&P for full billable details  Reason for admission: Abdominal pain secondary to large inguinal hernia.  Concern for SBO.  Interval events: Patient was seen and examined in the emergency room this morning.  Reports resolution of his pain after reduction of the hernia in the emergency room.  Reports that pain prior to coming in was excruciating very severe however the patient is pain-free at this time.  Still is somewhat nauseous.  Has not tried to eat.  Patient is n.p.o. pending formal surgical consultation.  Plan: Continue n.p.o. status IV fluids Pain control as needed Antiemetics as needed Appreciate general surgery follow-up  Lolita Patella MD

## 2020-08-02 NOTE — ED Triage Notes (Signed)
Pt arrived to ED via EMS for the "worst abd pain of my life." Pt was unable to sit still in triage and unwilling to answer questions fully just requesting staff to help him. RN unable to get reliable vitals in triage due to pt moving around in wheelchair. Pt reports he has chronic pancreatitis but denies this pain feeling like flair ups in the past and denies drinking anything tonight. Nausea without vomiting. No fevers per pt and no radiating to pts back.

## 2020-08-02 NOTE — ED Notes (Signed)
Report given to Kathryn RN

## 2020-08-02 NOTE — Discharge Summary (Signed)
AMA discharge summary  Patient was admitted and treated for a large inguinal hernia and concern for strangulation versus small bowel obstruction.  Hernia was causing significant pain.  This was reduced after administration of pain medication by ED provider with good result.  Patient remained relatively pain-free.  He was seen by surgery who did not suggest any urgent surgical intervention.  Per surgery okay for trial of soft diet and potentially discharge home if tolerating and pain-free.  I advanced diet however shortly thereafter was informed by ED nurse that patient had removed his IV and want to leave AGAINST MEDICAL ADVICE.  I instructed the nurse to let the patient know that we could give him a trial of p.o. intake and that if he tolerated I would discharge without having the need for AMA.  I did not hear back and later was informed by the nurse that patient left AGAINST MEDICAL ADVICE.  Lolita Patella MD

## 2020-08-02 NOTE — ED Notes (Signed)
Surgery at bedside.

## 2020-08-05 ENCOUNTER — Ambulatory Visit: Payer: Self-pay | Admitting: General Surgery

## 2020-08-05 ENCOUNTER — Other Ambulatory Visit: Payer: Self-pay

## 2020-08-05 ENCOUNTER — Other Ambulatory Visit
Admission: RE | Admit: 2020-08-05 | Discharge: 2020-08-05 | Disposition: A | Discharge status: Home / Self Care | Payer: Medicare Other | Source: Ambulatory Visit | Attending: General Surgery | Admitting: General Surgery

## 2020-08-05 HISTORY — DX: Anemia, unspecified: D64.9

## 2020-08-05 NOTE — Pre-Procedure Instructions (Signed)
Ok per Anesthesiology for patient to continue to take L-Citrulline and arginine, if takes for blood pressure.

## 2020-08-05 NOTE — Patient Instructions (Addendum)
Your procedure is scheduled on: Friday August 12, 2020. Report to Day Surgery inside [facility] 2nd floor. To find out your arrival time please call [phone] between 1PM - 3PM on August 11, 2020.  Remember: Instructions that are not followed completely may result in serious medical risk,  up to and including death, or upon the discretion of your surgeon and anesthesiologist your  surgery may need to be rescheduled.     _X__ 1. Do not eat food after midnight the night before your procedure.                 No chewing gum or hard candies. You may drink clear liquids up to 2 hours                 before you are scheduled to arrive for your surgery- DO not drink clear                 liquids within 2 hours of the start of your surgery.                 Clear Liquids include:  water, apple juice without pulp, clear Gatorade, G2 or                  Gatorade Zero (avoid Red/Purple/Blue), Black Coffee or Tea (Do not add                 anything to coffee or tea).  __X__2.  On the morning of surgery brush your teeth with toothpaste and water, you                may rinse your mouth with mouthwash if you wish.  Do not swallow any toothpaste of mouthwash.     _X__ 3.  No Alcohol for 24 hours before or after surgery.   _X__ 4.  Do Not Smoke or use e-cigarettes For 24 Hours Prior to Your Surgery.                 Do not use any chewable tobacco products for at least 6 hours prior to                 Surgery.  _X__  5.  Do not use any recreational drugs (marijuana, cocaine, heroin, ecstasy, MDMA or other)                For at least one week prior to your surgery.  Combination of these drugs with anesthesia                May have life threatening results.  __X__ 6.  Notify your doctor if there is any change in your medical condition      (cold, fever, infections).     Do not wear jewelry, make-up, hairpins, clips or nail polish. Do not wear lotions, powders, or  perfumes. You may wear deodorant. Do not shave 48 hours prior to surgery. Men may shave face and neck. Do not bring valuables to the hospital.    [facility] is not responsible for any belongings or valuables.  Contacts, dentures or bridgework may not be worn into surgery. Leave your suitcase in the car. After surgery it may be brought to your room. For patients admitted to the hospital, discharge time is determined by your treatment team.   Patients discharged the day of surgery will not be allowed to drive home.   Make arrangements for someone to be with  you for the first 24 hours of your Same Day Discharge.  __X__ Take these medicines the morning of surgery with A SIP OF WATER:    1. None    ____ Fleet Enema (as directed)   __X__ Use CHG Soap (or wipes) as directed  ____ Use Benzoyl Peroxide Gel as instructed  ____ Use inhalers on the day of surgery  ____ Stop metformin 2 days prior to surgery    ____ Take 1/2 of usual insulin dose the night before surgery. No insulin the morning          of surgery.   ____ Stop Coumadin/Plavix/aspirin   __X__ Stop Anti-inflammatories such as Ibuprofen, Aleve, Advil, naproxen, aspirin and or BC powders.   __X__ Stop supplements until after surgery.    Ok to continue to take (CARNITINE, L,) Citrulline (ok per anesthesiologist )  b complex vitamins, Multiple Vitamin  __X__ Do not start any herbal supplements before your procedure.   If you have any questions regarding your pre-procedure instructions,  Please call Pre-admit Testing at [phone].

## 2020-08-07 LAB — CULTURE, BLOOD (ROUTINE X 2)
Culture: NO GROWTH
Culture: NO GROWTH
Special Requests: ADEQUATE
Special Requests: ADEQUATE

## 2020-08-08 ENCOUNTER — Other Ambulatory Visit: Payer: Medicare Other

## 2020-08-08 ENCOUNTER — Ambulatory Visit: Payer: Self-pay | Admitting: General Surgery

## 2020-08-08 NOTE — H&P (Signed)
HISTORY OF PRESENT ILLNESS:    Mr. Kissick is a 69 y.o.male patient who comes for reevaluation of bilateral inguinal hernia.  Patient was previously evaluated for bilateral inguinal hernia repair.  After long discussion with the patient he was recommended to proceed with bilateral inguinal hernia.  The patient personally cancel his surgery due to multiple personal issues.  He recently went to the emergency room last week due to periumbilical pain.  The patient reported that that was he is worse pain however.  She reported that the inguinal hernias were harder than usual and he felt distended.  At the ED he had a CT scan of the abdomen.  That shows inguinal hernia with small bowel content.  Proximal small bowel was dilated.  I personally evaluated the images.  There was concern for obstruction due to incarcerated hernia.  The hernia was reduced at the ED and even surgical management was recommended at this moment the patient left AGAINST MEDICAL ADVICE.  Patient call back to my office to make follow-up appointment for reevaluation of the inguinal hernia.  He reported that the bilateral groin bulge increase intermittently.  He report associated pain that radiates to the testicles.  He also report intermittent abdominal distention.  Denies nausea or vomiting.  The distention resolves with reduction of the hernia.  There has been no other alleviating or aggravating factors.      PAST MEDICAL HISTORY:      Past Medical History:  Diagnosis Date  . Abdominal hernia   . Anemia   . Atherosclerosis of native arteries of extremity with intermittent claudication (CMS-HCC)   . Atrophy of muscle of multiple sites   . Chronic venous insufficiency   . GERD (gastroesophageal reflux disease)   . Hepatitis   . Hypertension   . Neuropathy   . Pancreatitis   . Peripheral neuropathy         PAST SURGICAL HISTORY:   History reviewed. No pertinent surgical history.       MEDICATIONS:  Encounter  Medications        Outpatient Encounter Medications as of 08/08/2020  Medication Sig Dispense Refill  . alpha lipoic acid 300 mg capsule Take 300 mg by mouth once daily    . ARGININE-GLUTAMINE ORAL Take by mouth Arginine AKG -- take 1 heaping tsp twice daily    . b complex vitamins capsule Take 1 capsule by mouth once daily    . biotin 5 mg capsule Take 5 mg by mouth once daily    . BORON ORAL Take 5 mg by mouth 2 (two) times daily    . Boswellia serrata extract (BOSWELLIA SERRATA XT, BULK, MISC) Take 1,150 mg by mouth 2 (two) times daily    . citrulline, bulk, Powd Take 2 g by mouth 2 (two) times daily       . co-enzyme Q-10, ubiquinone, (COQ-10) 100 mg capsule Take 100 mg by mouth once daily    . MACA EXTRACT ORAL Take 1,200 mg by mouth 3 (three) times daily       . mv-mn-herbal #208-b-sitosterol 100 mg Tab 1000 mg 1/2 tsp powder daily      . NON FORMULARY Benfotiamine -- (Vit B1) -- 1- 300 mg capsule daily    . pancrelipase (CREON) 36,000-114,000-180,000 unit DR capsule Take 2 caps by mouth prior to meals and 1 capsule by mouth prior to snacks.  Take right before the first bite of food. 240 capsule 2  . RESVERATROL ORAL Take 1,000 mg by  mouth once daily    . RUTIN ORAL Take 450 mg by mouth 2 (two) times daily        No facility-administered encounter medications on file as of 08/08/2020.       ALLERGIES:   Lyrica [pregabalin]   SOCIAL HISTORY:  Social History          Socioeconomic History  . Marital status: Single    Spouse name: Not on file  . Number of children: 0  . Years of education: 9  . Highest education level: 11th grade  Occupational History  . Occupation: Retired Licensed conveyancer  Tobacco Use  . Smoking status: Current Every Day Smoker    Types: Pipe  . Smokeless tobacco: Never Used  . Tobacco comment: smokes pipe equivilent to about 3-6 cigarettes  Vaping Use  . Vaping Use: Never used  Substance and Sexual Activity  . Alcohol  use: Not Currently  . Drug use: Not Currently  . Sexual activity: Yes    Partners: Female    Birth control/protection: None  Other Topics Concern  . Not on file  Social History Narrative  . Not on file   Social Determinants of Health   Financial Resource Strain: Not on file  Food Insecurity: Not on file  Transportation Needs: Not on file      FAMILY HISTORY:       Family History  Problem Relation Age of Onset  . Diabetes Mother   . Osteoporosis (Thinning of bones) Mother   . Stroke Mother   . Suicidality Father   . Autoimmune disease Sister   . HIV Brother      GENERAL REVIEW OF SYSTEMS:   General ROS: negative for - chills, fatigue, fever, weight gain or weight loss Allergy and Immunology ROS: negative for - hives  Hematological and Lymphatic ROS: negative for - bleeding problems or bruising, negative for palpable nodes Endocrine ROS: negative for - heat or cold intolerance, hair changes Respiratory ROS: negative for - cough, shortness of breath or wheezing Cardiovascular ROS: no chest pain or palpitations GI ROS: negative for nausea, vomiting, diarrhea, constipation.  Positive for abdominal pain Musculoskeletal ROS: negative for - joint swelling or muscle pain Neurological ROS: negative for - confusion, syncope Dermatological ROS: negative for pruritus and rash  PHYSICAL EXAM:     Vitals:   08/08/20 1332  BP: 137/81  Pulse: 78  .  Ht:167.6 cm (5\' 6" ) Wt:63.5 kg (140 lb) surface area is 1.72 meters squared. Body mass index is 22.6 kg/m.UTM:LYYT   GENERAL: Alert, active, oriented x3  HEENT: Pupils equal reactive to light. Extraocular movements are intact. Sclera clear. Palpebral conjunctiva normal red color.Pharynx clear.  NECK: Supple with no palpable mass and no adenopathy.  LUNGS: Sound clear with no rales rhonchi or wheezes.  HEART: Regular rhythm S1 and S2 without murmur.  ABDOMEN: Soft and depressible, nontender with no  palpable mass, no hepatomegaly.  Bilateral reducible inguinal hernias.  No bowel distention.  EXTREMITIES: Well-developed well-nourished symmetrical with no dependent edema.  NEUROLOGICAL: Awake alert oriented, facial expression symmetrical, moving all extremities.      IMPRESSION:     Bilateral inguinal hernia without obstruction or gangrene, recurrence not specified [K40.20]   Patient with bilateral inguinal hernia.  He had an episode of abdominal distention and CT showing intestinal dilation with transition point at the right inguinal hernia.  On physical exam right inguinal hernia was able to be reduced but it was difficult.  I think that  this patient is having worsening symptoms from the inguinal hernia.  I will try to coordinate surgical management soon as possible to avoid any further episode of incarceration and/or obstruction due to inguinal hernia.  Patient again was oriented about the surgical procedure.  He was also very oriented about the use of mesh and its risk of complications such as: infection, bleeding, injury to vas deference, vasculature and testicle, injury to bowel or bladder, and chronic pain.    Patient report understood and agreed to proceed.   PLAN:  1. Robotic assisted laparoscopic bilateral inguinal hernia repair with mesh (00459) 2.  CBC, CMP (done 08/02/20 at [facility]) 3.  Avoid taking aspirin 5 days before procedure 4.  Contact us if has any question or concern.  Patient verbalized understanding, all questions were answered, and were agreeable with the plan outlined above.   Carolan Shiver, MD  Electronically signed by Carolan Shiver, MD

## 2020-08-10 ENCOUNTER — Other Ambulatory Visit: Payer: Medicare Other

## 2020-08-12 ENCOUNTER — Ambulatory Visit: Admission: RE | Admit: 2020-08-12 | Payer: Medicare Other | Source: Home / Self Care | Admitting: General Surgery

## 2020-08-12 ENCOUNTER — Encounter: Admission: RE | Payer: Self-pay | Source: Home / Self Care

## 2020-08-12 SURGERY — REPAIR, HERNIA, INGUINAL, ROBOT-ASSISTED, LAPAROSCOPIC, USING MESH
Anesthesia: General | Site: Groin | Laterality: Bilateral

## 2020-12-28 LAB — COLOGUARD: COLOGUARD: NEGATIVE

## 2021-02-15 ENCOUNTER — Telehealth: Payer: Self-pay

## 2021-02-15 NOTE — Telephone Encounter (Signed)
Patient declined lung screening CT scan. He said he has our number should he change his mind

## 2021-03-16 ENCOUNTER — Encounter (INDEPENDENT_AMBULATORY_CARE_PROVIDER_SITE_OTHER): Payer: Medicare Other

## 2021-03-16 ENCOUNTER — Ambulatory Visit (INDEPENDENT_AMBULATORY_CARE_PROVIDER_SITE_OTHER): Payer: Medicare Other | Admitting: Vascular Surgery

## 2021-06-12 IMAGING — MR MR LUMBAR SPINE W/O CM
5 series · 31 of 48 positions shown · non-contrast
Comparison: None.

CLINICAL DATA: Lower extremity numbness. Remote history of work
injury.

EXAM:
MRI LUMBAR SPINE WITHOUT CONTRAST
TECHNIQUE: Multiplanar, multisequence MR imaging of the lumbar spine was
performed. No intravenous contrast was administered.

[Series 5: T2 · sagittal · 4.0mm · 0.81mm/px · 6 of 17 slices shown (1 of 2)]
[im 1/17]
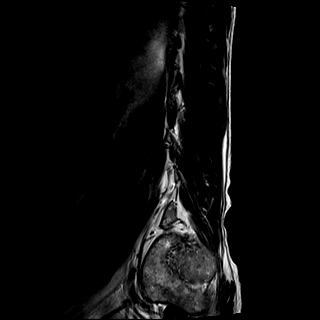
[im 4/17]
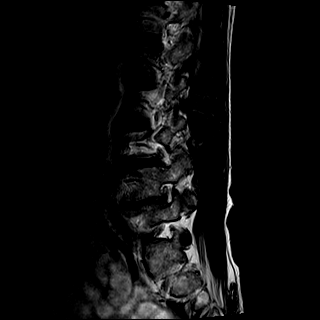
[im 7/17]
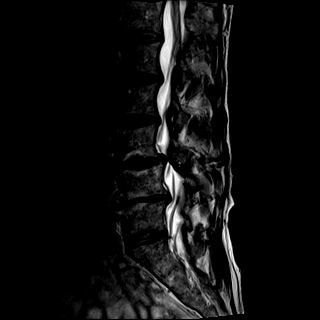
[im 10/17]
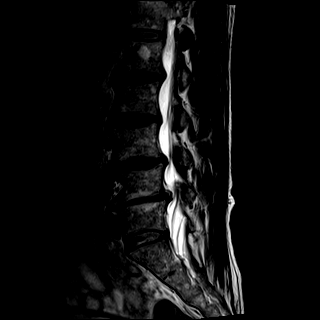
[im 13/17]
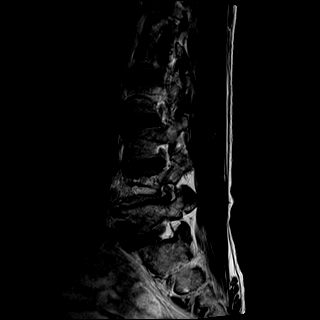
[im 17/17]
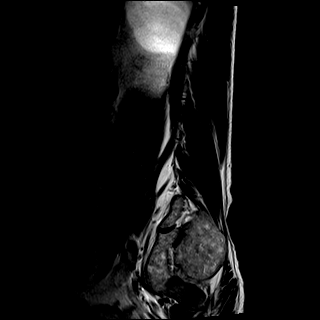

[Series 6: T1 · sagittal · 4.0mm · 0.81mm/px · 7 of 17 slices shown (1 of 2)]
[im 1/17]
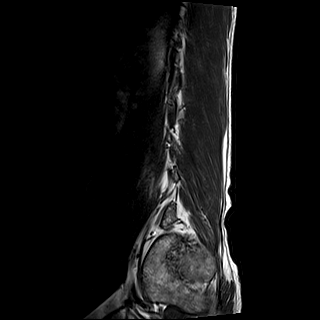
[im 3/17]
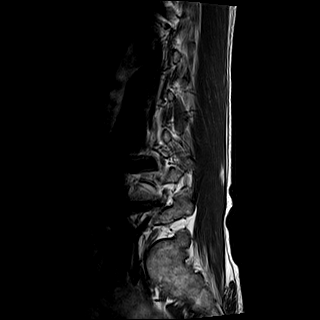
[im 6/17]
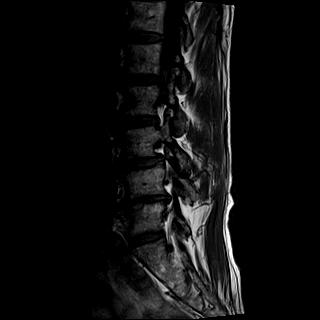
[im 9/17]
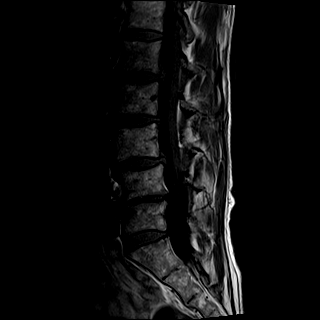
[im 11/17]
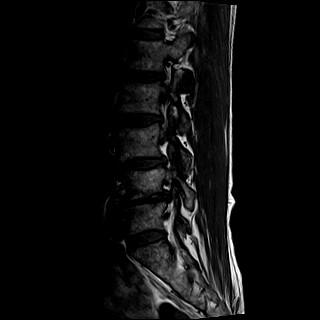
[im 14/17]
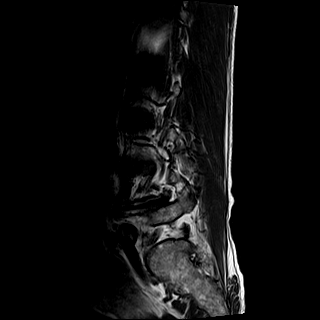
[im 17/17]
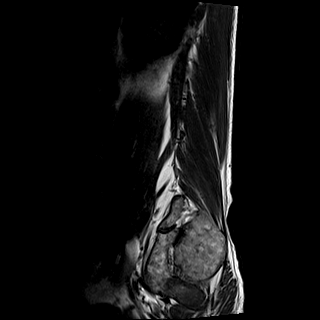

[Series 7: STIR · sagittal · 4.0mm · 0.41mm/px · 2 of 17 slices shown]
[im 1/17]
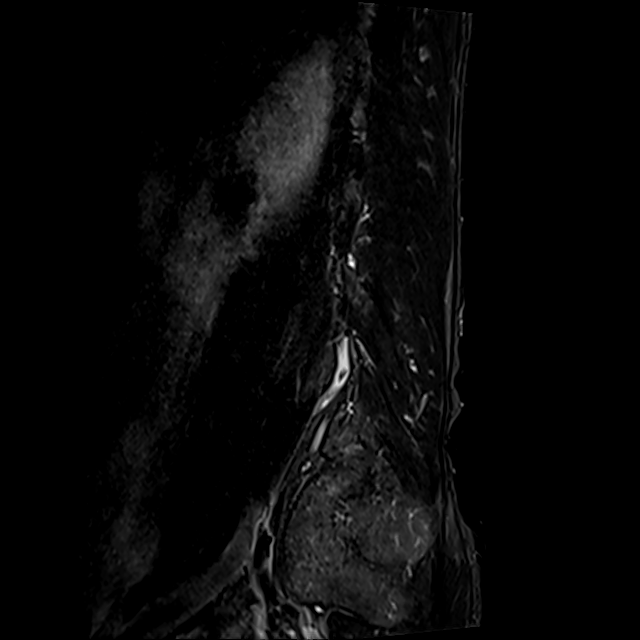
[im 3/17]
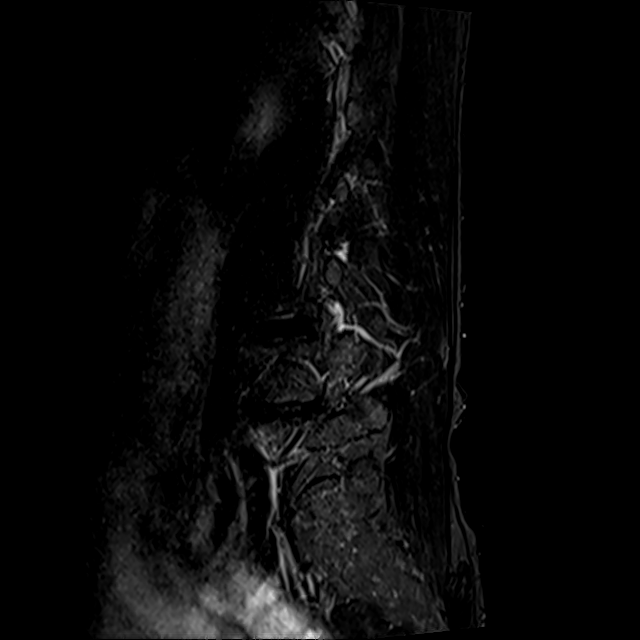

[Series 8: T2 · axial · 4.0mm · 0.78mm/px · z∈[-91,+111]mm · 8 of 34 slices shown (2 of 2)]
[im 1/34]
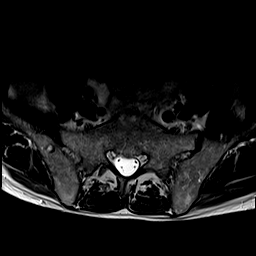
[im 6/34]
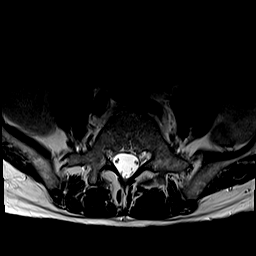
[im 11/34]
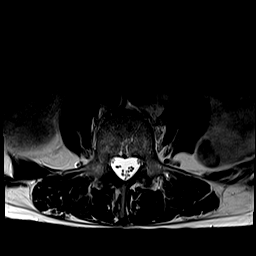
[im 16/34]
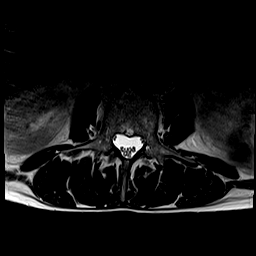
[im 18/34]
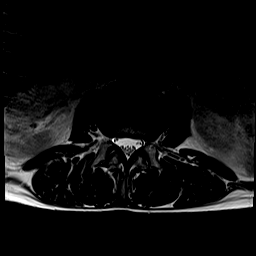
[im 23/34]
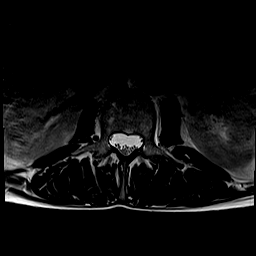
[im 28/34]
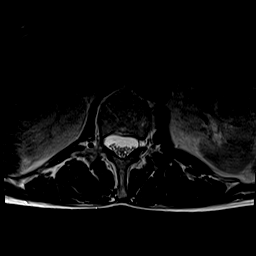
[im 34/34]
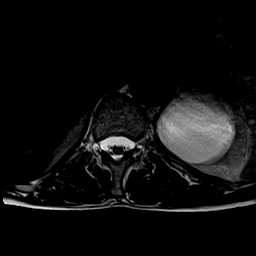

[Series 9: T1 · axial · 4.0mm · 0.39mm/px · z∈[-91,+111]mm · 8 of 34 slices shown (2 of 2)]
[im 1/34]
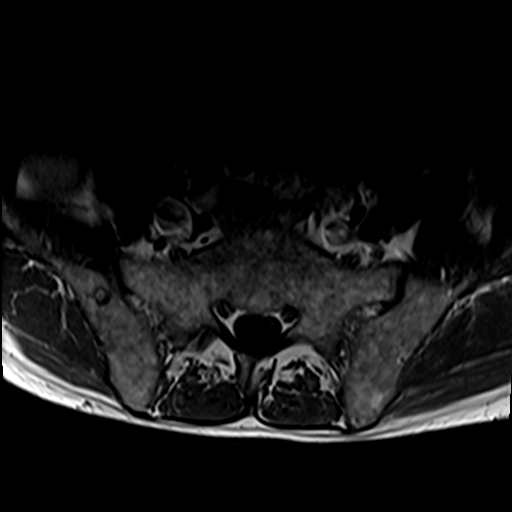
[im 6/34]
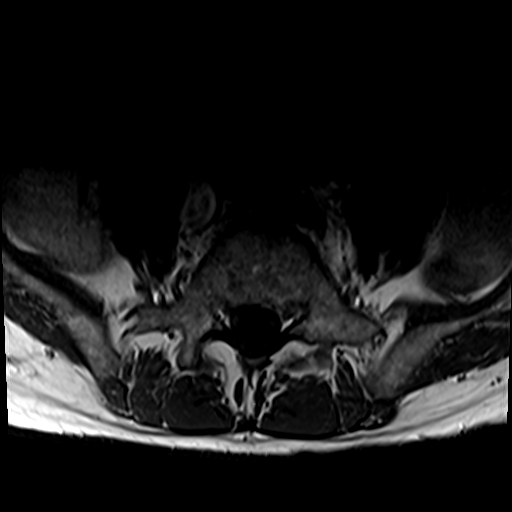
[im 11/34]
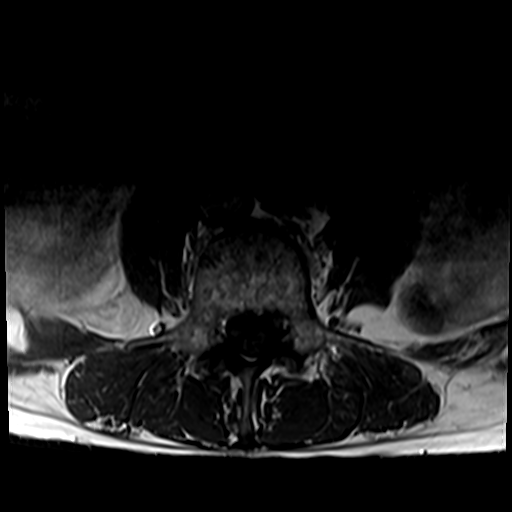
[im 16/34]
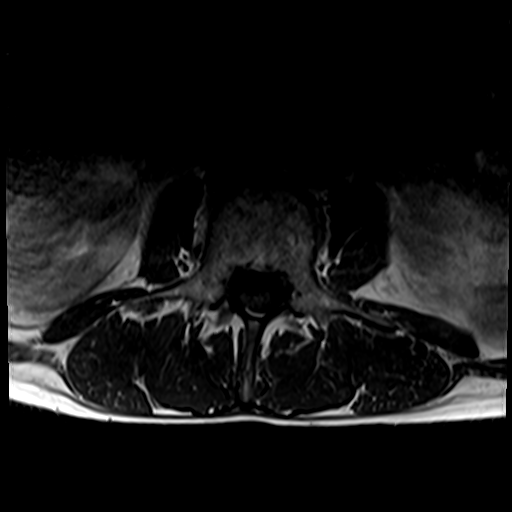
[im 18/34]
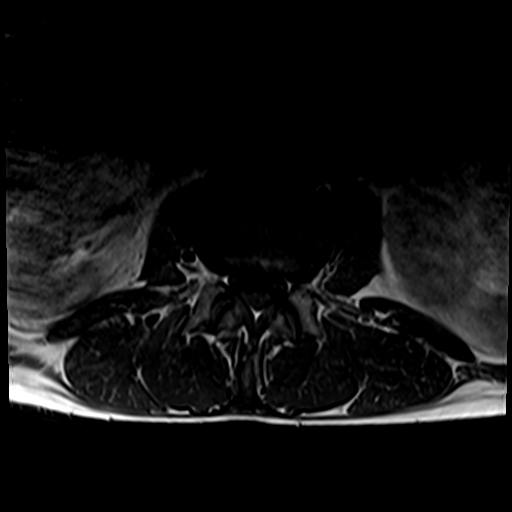
[im 23/34]
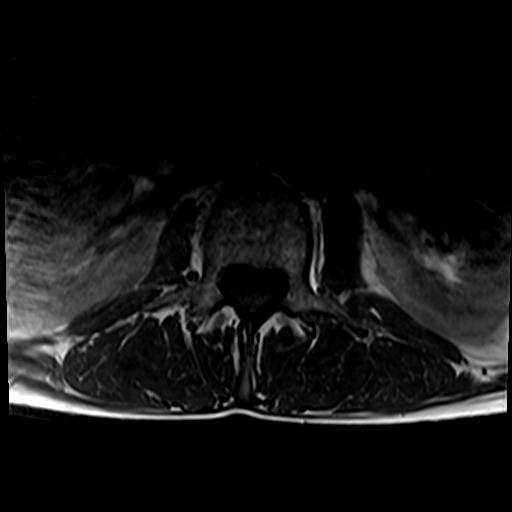
[im 28/34]
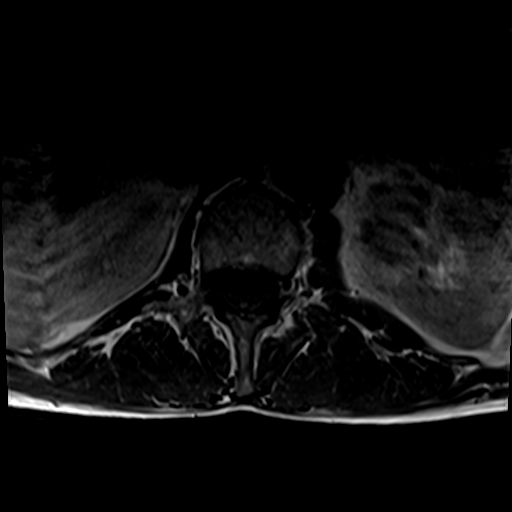
[im 34/34]
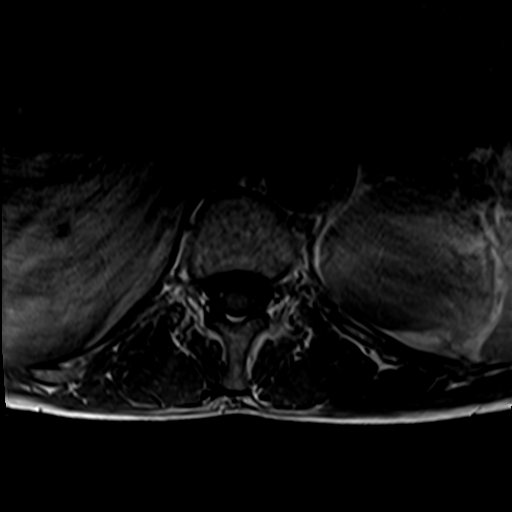

[31 of 48 positions shown; findings below may reference images not displayed]

FINDINGS: Segmentation:  Normal

Alignment:  Grade 1 anterolisthesis at L3-4

Vertebrae: No acute compression fracture, discitis-osteomyelitis,
facet edema or other focal marrow lesion. No epidural collection.
There is discogenic endplate signal change at L4-5.

Conus medullaris and cauda equina: Conus extends to the L1 level.
Conus and cauda equina appear normal.

Paraspinal and other soft tissues: Left renal cyst measures 5.8 cm.

Disc levels:

T12-L1: Normal disc space and facets. No spinal canal or
neuroforaminal stenosis.

L1-L2: Disc desiccation with small central protrusion. No spinal
canal or neural foraminal stenosis.

L2-L3: Disc desiccation and mild circumferential bulge. No stenosis.

L3-L4: Disc desiccation and mild circumferential bulge with annular
fissure in the extraforaminal zone. No spinal canal stenosis.
Moderate right and mild left neural foraminal stenosis.

L4-L5: Intermediate circumferential disc bulge with endplate
spurring. Mild right and moderate left neural foraminal stenosis.
There is narrowing of the lateral recesses without central spinal
canal stenosis.

L5-S1: Normal disc space and facets. No spinal canal or
neuroforaminal stenosis.

Visualized sacrum: Normal.
IMPRESSION: 1. Moderate right L3 and left L4 neural foraminal stenosis.
2. No central spinal canal stenosis.

## 2021-06-14 ENCOUNTER — Ambulatory Visit: Payer: Self-pay | Admitting: General Surgery

## 2021-06-14 ENCOUNTER — Emergency Department
Admission: EM | Admit: 2021-06-14 | Discharge: 2021-06-14 | Disposition: A | Discharge status: Home / Self Care | Payer: Medicare Other | Attending: Emergency Medicine | Admitting: Emergency Medicine

## 2021-06-14 ENCOUNTER — Emergency Department: Payer: Medicare Other

## 2021-06-14 ENCOUNTER — Other Ambulatory Visit: Payer: Self-pay

## 2021-06-14 DIAGNOSIS — K460 Unspecified abdominal hernia with obstruction, without gangrene: Secondary | ICD-10-CM

## 2021-06-14 DIAGNOSIS — Z20822 Contact with and (suspected) exposure to covid-19: Secondary | ICD-10-CM | POA: Insufficient documentation

## 2021-06-14 DIAGNOSIS — K4030 Unilateral inguinal hernia, with obstruction, without gangrene, not specified as recurrent: Secondary | ICD-10-CM | POA: Insufficient documentation

## 2021-06-14 DIAGNOSIS — R000 Tachycardia, unspecified: Secondary | ICD-10-CM | POA: Diagnosis not present

## 2021-06-14 DIAGNOSIS — I10 Essential (primary) hypertension: Secondary | ICD-10-CM | POA: Insufficient documentation

## 2021-06-14 DIAGNOSIS — F17210 Nicotine dependence, cigarettes, uncomplicated: Secondary | ICD-10-CM | POA: Insufficient documentation

## 2021-06-14 LAB — BASIC METABOLIC PANEL
Anion gap: 12 (ref 5–15)
BUN: 20 mg/dL (ref 8–23)
CO2: 23 mmol/L (ref 22–32)
Calcium: 9.8 mg/dL (ref 8.9–10.3)
Chloride: 103 mmol/L (ref 98–111)
Creatinine, Ser: 1.02 mg/dL (ref 0.61–1.24)
GFR, Estimated: >60 mL/min (ref >60)
Glucose, Bld: 114 mg/dL — ABNORMAL HIGH (ref 70–99)
Potassium: 3.4 mmol/L — ABNORMAL LOW (ref 3.5–5.1)
Sodium: 138 mmol/L (ref 135–145)

## 2021-06-14 LAB — CBC WITH DIFFERENTIAL/PLATELET
Abs Immature Granulocytes: 0.05 10*3/uL (ref 0.00–0.07)
Basophils Absolute: 0.1 10*3/uL (ref 0.0–0.1)
Basophils Relative: 1 %
Eosinophils Absolute: 0.5 10*3/uL (ref 0.0–0.5)
Eosinophils Relative: 4 %
HCT: 41.7 % (ref 39.0–52.0)
Hemoglobin: 14.4 g/dL (ref 13.0–17.0)
Immature Granulocytes: 0 %
Lymphocytes Relative: 33 %
Lymphs Abs: 4.2 10*3/uL — ABNORMAL HIGH (ref 0.7–4.0)
MCH: 25 pg — ABNORMAL LOW (ref 26.0–34.0)
MCHC: 34.5 g/dL (ref 30.0–36.0)
MCV: 72.3 fL — ABNORMAL LOW (ref 80.0–100.0)
Monocytes Absolute: 0.7 10*3/uL (ref 0.1–1.0)
Monocytes Relative: 6 %
Neutro Abs: 7.1 10*3/uL (ref 1.7–7.7)
Neutrophils Relative %: 56 %
Platelets: 266 10*3/uL (ref 150–400)
RBC: 5.77 MIL/uL (ref 4.22–5.81)
RDW: 17 % — ABNORMAL HIGH (ref 11.5–15.5)
Smear Review: NORMAL
WBC: 12.6 10*3/uL — ABNORMAL HIGH (ref 4.0–10.5)
nRBC: 0.2 % (ref 0.0–0.2)

## 2021-06-14 LAB — RESP PANEL BY RT-PCR (FLU A&B, COVID) ARPGX2
Influenza A by PCR: NEGATIVE
Influenza B by PCR: NEGATIVE
SARS Coronavirus 2 by RT PCR: NEGATIVE

## 2021-06-14 MED ORDER — IOHEXOL 350 MG/ML SOLN
100.0000 mL | Freq: Once | INTRAVENOUS | Status: AC | PRN
  Administered 2021-06-14: 100 mL via INTRAVENOUS

## 2021-06-14 MED ORDER — HYDROMORPHONE HCL 1 MG/ML PO LIQD
1.0000 mg | Freq: Once | ORAL | Status: DC
Start: 2021-06-14 — End: 2021-06-14
  Filled 2021-06-14: qty 1

## 2021-06-14 MED ORDER — ONDANSETRON HCL 4 MG/2ML IJ SOLN
4.0000 mg | Freq: Once | INTRAMUSCULAR | Status: AC
  Administered 2021-06-14: 4 mg via INTRAVENOUS
  Filled 2021-06-14: qty 2

## 2021-06-14 MED ORDER — MORPHINE SULFATE (PF) 4 MG/ML IV SOLN
4.0000 mg | Freq: Once | INTRAVENOUS | Status: AC
  Administered 2021-06-14: 4 mg via INTRAVENOUS
  Filled 2021-06-14: qty 1

## 2021-06-14 NOTE — ED Provider Notes (Signed)
Emergency Medicine Provider Triage Evaluation Note  Tom Pace, a 70 y.o. male  was evaluated in triage.  Pt complains of acute right groin pain. He gives history of a large inguinal hernia, that is acutely painful. He notes he can normally reduce it manually. He notes onset of pain approximately 3 hours ago. He denies NVD, FC.   Review of Systems  Positive: Right inguinal pain Negative: NV  Physical Exam  There were no vitals taken for this visit. Gen:   Awake, no distress  uncomfortable Resp:  Normal effort CTA MSK:   Moves extremities without difficulty  Other:  ABD: large RIH  Medical Decision Making  Medically screening exam initiated at 3:25 PM.  Appropriate orders placed.  Tom Pace was informed that the remainder of the evaluation will be completed by another provider, this initial triage assessment does not replace that evaluation, and the importance of remaining in the ED until their evaluation is complete.  Patient with ED evaluation of acute right inguinal pain.    Lissa Hoard, PA-C 06/14/21 1532    Delton Prairie, MD 06/14/21 [phone]

## 2021-06-14 NOTE — ED Triage Notes (Signed)
Pt states he was on his way to see Dr Sampson Goon his PCP today for a routine check up and had sudden onset right sided scrotal pain, PCP called states pt has an incarcerated inguinal hernia. Pt is in a wheel chair on arrival from [facility] groaning and rocking

## 2021-06-14 NOTE — H&P (View-Only) (Signed)
SURGICAL CONSULTATION NOTE   HISTORY OF PRESENT ILLNESS (HPI):  70 y.o. male presented to [facility] ED for evaluation of right inguinal hernia. Patient reports this morning his hernia got really big and harder than usual.  He had a lot of pain.  Pain localized to the right groin.  No pain radiation.  Aggravating factor was ambulating.  No alleviating factors.  At the ED he had a CT scan that shows bilateral ankle hernia with right hernia with intestinal content.  I personally evaluated the images.  Upon evaluation at the ED patient was of right inguinal hernia that was able to be reduced.  Right groin soft without any skin changes.  Patient feeling comfortable.  Patient without any nausea or vomiting.  Surgery is consulted by Dr. Sidney Ace in this context for evaluation and management of right inguinal hernia.  PAST MEDICAL HISTORY (PMH):  Past Medical History:  Diagnosis Date   Anemia    Hepatitis C    Hypertension    Neuropathy    Pancreatitis    Seizures (HCC)    stated had a seizure after he took lyrica      PAST SURGICAL HISTORY (PSH):  Past Surgical History:  Procedure Laterality Date   NO PAST SURGERIES       MEDICATIONS:  Prior to Admission medications   Medication Sig Start Date End Date Taking? Authorizing Provider  Acetylcarnitine HCl (ACETYL L-CARNITINE PO) Take 600 mg by mouth daily. Patient not taking: Reported on 08/02/2020    [provider]  Alpha-Lipoic Acid 300 MG TABS Take 300 mg by mouth in the morning and at bedtime. Patient not taking: Reported on 08/02/2020    [provider]  Arginine 2000 MG PACK Take 2 g by mouth in the morning and at bedtime. Patient not taking: Reported on 08/02/2020    [provider]  ASTAXANTHIN PO Take 10 mg by mouth daily. Patient not taking: Reported on 08/02/2020    [provider]  b complex vitamins tablet Take 1 tablet by mouth daily.     [provider]  Bioflavonoid Products (ESTER  C PO) Take 1,000 mg by mouth in the morning and at bedtime. Patient not taking: Reported on 08/02/2020    [provider]  BORON PO Take 5 mg by mouth in the morning and at bedtime. Patient not taking: Reported on 08/02/2020    [provider]  Boswellia Serrata (BOSWELLIA PO) Take 800 mg by mouth in the morning and at bedtime. Patient not taking: Reported on 08/02/2020    [provider]  Cats Claw 400 MG CAPS Take 400 mg by mouth daily. Patient not taking: Reported on 08/02/2020    [provider]  Cholecalciferol (VITAMIN D) 125 MCG (5000 UT) CAPS Take 5,000 Units by mouth daily. Patient not taking: Reported on 08/02/2020    [provider]  Citrulline (CITRULLINE 1000) 1 g PACK Take 2 g by mouth in the morning and at bedtime.     [provider]  COLLAGEN PO Take 1 g by mouth in the morning and at bedtime. Patient not taking: Reported on 08/02/2020    [provider]  CREON 36000-114000 units CPEP capsule Take 2 capsules by mouth See admin instructions. Take 2 capsule by mouth 3 times daily with meals and 1 capsule by mouth with snack. 12/11/18   [provider]  famotidine (PEPCID) 20 MG tablet Take 1 tablet (20 mg total) by mouth at bedtime. Patient  not taking: Reported on 05/20/2020 12/09/18 12/09/19  Nita Sickle, MD  Glecaprevir-Pibrentasvir (MAVYRET PO) Take by mouth. Patient not taking: Reported on 05/20/2020    [provider]  levOCARNitine (CARNITINE, L,) POWD Take 500 mg by mouth daily.     [provider]  lisinopril (ZESTRIL) 10 MG tablet TK 1 T PO QAM Patient not taking: Reported on 03/17/2020 04/20/19   [provider]  Maca 500 MG CAPS Take 500 mg by mouth in the morning, at noon, and at bedtime. Patient not taking: Reported on 08/02/2020    [provider]  MAGNESIUM CITRATE PO Take 2,500 mg by mouth in the morning and at bedtime. Patient not taking: Reported on 08/02/2020     [provider]  Methylcobalamin (METHYL B-12 PO) Take 1,000 mg by mouth in the morning and at bedtime. Patient not taking: Reported on 08/02/2020    [provider]  MILK THISTLE EXTRACT PO Take 1 capsule by mouth in the morning and at bedtime. Patient not taking: Reported on 08/02/2020    [provider]  Misc Natural Products (BETA-SITOSTEROL PLANT STEROLS) CAPS Take 1 capsule by mouth daily. Patient not taking: Reported on 08/02/2020    [provider]  Multiple Vitamin (MULTIVITAMIN WITH MINERALS) TABS tablet Take 1 tablet by mouth daily.     [provider]  Multiple Vitamins-Minerals (PRESERVISION AREDS 2 PO) Take 1 capsule by mouth in the morning and at bedtime. Patient not taking: Reported on 08/02/2020    [provider]  Nettle, Urtica Dioica, (NETTLE LEAF) 435 MG CAPS Take by mouth. Patient not taking: Reported on 08/02/2020    [provider]  ondansetron (ZOFRAN ODT) 4 MG disintegrating tablet Take 1 tablet (4 mg total) by mouth every 8 (eight) hours as needed. Patient not taking: Reported on 07/23/2019 12/11/18   Rockne Menghini, MD  OVER THE COUNTER MEDICATION Take 1 capsule by mouth in the morning and at bedtime. Adaptogenic Complex Patient not taking: Reported on 08/02/2020    [provider]  OVER THE COUNTER MEDICATION Take 500 mg by mouth in the morning, at noon, and at bedtime. D Ribose Patient not taking: Reported on 08/02/2020    [provider]  oxymetazoline (AFRIN) 0.05 % nasal spray Place 1 spray into both nostrils 2 (two) times daily as needed for congestion.  Patient not taking: Reported on 08/02/2020    [provider]  pantoprazole (PROTONIX) 40 MG tablet  03/24/19   [provider]  Potassium Citrate 99 MG CAPS Take 99 mg by mouth daily. Patient not taking: Reported on 08/02/2020    [provider]  RESVERATROL PO Take 500-1,000 mg by mouth See admin  instructions. Take 1000 mg by mouth in the morning and 500 mg at night Patient not taking: Reported on 08/02/2020    [provider]  rOPINIRole (REQUIP) 0.25 MG tablet  03/30/19   [provider]  RUTIN PO Take 1 tablet by mouth in the morning and at bedtime. Patient not taking: Reported on 08/02/2020    [provider]  Sodium Hyaluronate, oral, (HYALURONIC ACID) 100 MG CAPS Take 250 mg by mouth every morning. Patient not taking: Reported on 08/02/2020    [provider]  Thiamine Mononitrate (B1 PO) Take 300 mg by mouth daily. Patient not taking: Reported on 08/02/2020    [provider]  traMADol-acetaminophen (ULTRACET) 37.5-325 MG tablet TK 1 T PO Q 6 H Patient not taking: Reported on 03/17/2020 02/25/19  [provider]  Zinc Picolinate 25 MG TABS Take 25 mg by mouth daily. Patient not taking: Reported on 08/02/2020    [provider]     ALLERGIES:  Allergies  Allergen Reactions   Pregabalin Other (See Comments)    "Violent seizures"     SOCIAL HISTORY:  Social History   Socioeconomic History   Marital status: Single    Spouse name: Not on file   Number of children: Not on file   Years of education: Not on file   Highest education level: Not on file  Occupational History   Not on file  Tobacco Use   Smoking status: Some Days    Packs/day: 1.50    Years: 49.00    Pack years: 73.50    Types: Pipe, Cigarettes    Last attempt to quit: 2019    Years since quitting: 3.7   Smokeless tobacco: Never  Vaping Use   Vaping Use: Never used  Substance and Sexual Activity   Alcohol use: Not Currently   Drug use: Not Currently    Types: Marijuana   Sexual activity: Not on file  Other Topics Concern   Not on file  Social History Narrative   Not on file   Social Determinants of Health   Financial Resource Strain: Not on file  Food Insecurity: Not on file  Transportation Needs: Not on file  Physical Activity: Not on  file  Stress: Not on file  Social Connections: Not on file  Intimate Partner Violence: Not on file      FAMILY HISTORY:  Family History  Problem Relation Age of Onset   Diabetes Mother    Osteoporosis Mother    Suicidality Father      REVIEW OF SYSTEMS:  Constitutional: denies weight loss, fever, chills, or sweats  Eyes: denies any other vision changes, history of eye injury  ENT: denies sore throat, hearing problems  Respiratory: denies shortness of breath, wheezing  Cardiovascular: denies chest pain, palpitations  Gastrointestinal: abdominal pain, nausea and vomiting Genitourinary: denies burning with urination or urinary frequency Musculoskeletal: denies any other joint pains or cramps  Skin: denies any other rashes or skin discolorations  Neurological: denies any other headache, dizziness, weakness  Psychiatric: denies any other depression, anxiety   All other review of systems were negative   VITAL SIGNS:  Temp:  [97.7 F (36.5 C)] 97.7 F (36.5 C) (09/21 1530) Pulse Rate:  [88] 88 (09/21 1530) Resp:  [24] 24 (09/21 1530) BP: (182)/(122) 182/122 (09/21 1530) SpO2:  [99 %] 99 % (09/21 1530)             INTAKE/OUTPUT:  This shift: No intake/output data recorded.  Last 2 shifts: @IOLAST2SHIFTS @   PHYSICAL EXAM:  Constitutional:  -- Normal body habitus  -- Awake, alert, and oriented x3  Eyes:  -- Pupils equally round and reactive to light  -- No scleral icterus  Ear, nose, and throat:  -- No jugular venous distension  Pulmonary:  -- No crackles  -- Equal breath sounds bilaterally -- Breathing non-labored at rest Cardiovascular:  -- S1, S2 present  -- No pericardial rubs Gastrointestinal:  -- Abdomen soft, nontender, non-distended, no guarding or rebound tenderness -- Bilateral inguinal hernia that are reducible.  Right inguinal hernia bigger than left inguinal hernia.  Right inguinal hernia was reduced. Musculoskeletal and Integumentary:  -- Wounds:  None appreciated -- Extremities: B/L UE and LE FROM, hands and feet warm, no edema  Neurologic:  -- Motor  function: intact and symmetric -- Sensation: intact and symmetric   Labs:  CBC Latest Ref Rng & Units 06/14/2021 08/02/2020 08/02/2020  WBC 4.0 - 10.5 K/uL 12.6(H) 13.5(H) 17.8(H)  Hemoglobin 13.0 - 17.0 g/dL 38.1 12.0(L) 13.1  Hematocrit 39.0 - 52.0 % 41.7 35.4(L) 38.2(L)  Platelets 150 - 400 K/uL 266 198 225   CMP Latest Ref Rng & Units 06/14/2021 08/02/2020 08/02/2020  Glucose 70 - 99 mg/dL 829(H) 96 371(I)  BUN 8 - 23 mg/dL 20 18 23   Creatinine 0.61 - 1.24 mg/dL 9.67 8.93  Sodium 135 - 145 mmol/L 138 139 140  Potassium 3.5 - 5.1 mmol/L 3.4(L) 3.6 3.2(L)  Chloride 98 - 111 mmol/L 103 105 105  CO2 22 - 32 mmol/L 23 25 22   Calcium 8.9 - 10.3 mg/dL 9.8 8.10) 9.5  Total Protein 6.5 - 8.1 g/dL - - 7.6  Total Bilirubin 0.3 - 1.2 mg/dL - - 1.0  Alkaline Phos 38 - 126 U/L - - 72  AST 15 - 41 U/L - - 28  ALT 0 - 44 U/L - - 15    Imaging studies:  EXAM: CT ABDOMEN AND PELVIS WITH CONTRAST   TECHNIQUE: Multidetector CT imaging of the abdomen and pelvis was performed using the standard protocol following bolus administration of intravenous contrast.   CONTRAST:  OMNIPAQUE IOHEXOL 350 MG/ML SOLN   COMPARISON:  CT scan 08/02/2020   FINDINGS: Lower chest: The lung bases are clear of acute process. No pleural effusion or pulmonary lesions. The heart is normal in size. No pericardial effusion. The distal esophagus and aorta are unremarkable. Stable aortic and coronary artery calcifications.   Hepatobiliary: No hepatic lesions or intrahepatic biliary dilatation. The gallbladder is unremarkable. No common bile duct dilatation.   Pancreas: Stable scattered calcifications throughout the pancreas consistent with chronic calcific pancreatitis. No acute inflammation, mass lesion or ductal dilatation. Stable small cystic lesion in the pancreatic tail.   Spleen: Normal  size.  No focal lesions.   Adrenals/Urinary Tract: Adrenal glands are unremarkable.   Stable large upper pole left renal cyst. No worrisome renal lesions or hydronephrosis. The bladder is unremarkable.   Stomach/Bowel: Stomach and duodenum are unremarkable. The proximal and mid small bowel loops appear normal. The mid distal small bowel loops are slightly dilated and fluid-filled with scattered air-fluid levels suggesting early or partial small bowel obstruction. This is due to an incarcerated right inguinal hernia. The small bowel loop in the hernia demonstrates mild wall thickening and moderate inflammation/fluid extending down into the scrotum. The loop of bowel coming out of the hernia is the mid distal ileum and it is normal/decompressed.   The colon is unremarkable. Scattered diverticulosis but no evidence of acute diverticulitis.   Vascular/Lymphatic: Advanced atherosclerotic calcifications involving the aorta and iliac arteries. No aneurysm or dissection. The major venous structures are patent. No mesenteric or retroperitoneal mass or adenopathy.   Reproductive: The prostate gland and seminal vesicles are unremarkable.   Other: No free fluid or free air is identified.   Musculoskeletal: No significant bony findings.   IMPRESSION: 1. Incarcerated right inguinal hernia with associated early or partial distal small bowel obstruction. 2. Stable chronic calcific pancreatitis. 3. Advanced atherosclerotic calcifications involving the aorta and iliac arteries. 4. Aortic atherosclerosis.   Aortic Atherosclerosis (ICD10-I70.0).     Electronically Signed   By: M.D.   On: 06/14/2021 16:45  Assessment/Plan:  70 y.o. male with bilateral inguinal hernia, complicated by pertinent comorbidities including GERD,  hypertension.  Patient with initial concern of incarcerated right groin hernia.  Upon physical exam the right inguinal hernia was able to be reduced.  Due  to increased pain I offered the patient to admit him for surgical management of the inguinal hernia but patient endorses that he cannot afford an admission.  My second this option was to offer him outpatient bilateral inguinal hernia repair tomorrow.  Since the hernia is reduced, soft, abdomen is soft, and no sign of strangulation I think that is reasonable for patient to go home tonight and come tomorrow for bilateral inguinal hernia repair.  Patient reports that he preferred just option due to economic reasons.  Patient understand the risk of surgery and agreed to proceed.   Gae Gallop, MD

## 2021-06-14 NOTE — H&P (Signed)
SURGICAL CONSULTATION NOTE   HISTORY OF PRESENT ILLNESS (HPI):  70 y.o. male presented to [facility] ED for evaluation of right inguinal hernia. Patient reports this morning his hernia got really big and harder than usual.  He had a lot of pain.  Pain localized to the right groin.  No pain radiation.  Aggravating factor was ambulating.  No alleviating factors.  At the ED he had a CT scan that shows bilateral ankle hernia with right hernia with intestinal content.  I personally evaluated the images.  Upon evaluation at the ED patient was of right inguinal hernia that was able to be reduced.  Right groin soft without any skin changes.  Patient feeling comfortable.  Patient without any nausea or vomiting.  Surgery is consulted by Dr. Sidney Ace in this context for evaluation and management of right inguinal hernia.  PAST MEDICAL HISTORY (PMH):  Past Medical History:  Diagnosis Date   Anemia    Hepatitis C    Hypertension    Neuropathy    Pancreatitis    Seizures (HCC)    stated had a seizure after he took lyrica      PAST SURGICAL HISTORY (PSH):  Past Surgical History:  Procedure Laterality Date   NO PAST SURGERIES       MEDICATIONS:  Prior to Admission medications   Medication Sig Start Date End Date Taking? Authorizing Provider  Acetylcarnitine HCl (ACETYL L-CARNITINE PO) Take 600 mg by mouth daily. Patient not taking: Reported on 08/02/2020    [provider]  Alpha-Lipoic Acid 300 MG TABS Take 300 mg by mouth in the morning and at bedtime. Patient not taking: Reported on 08/02/2020    [provider]  Arginine 2000 MG PACK Take 2 g by mouth in the morning and at bedtime. Patient not taking: Reported on 08/02/2020    [provider]  ASTAXANTHIN PO Take 10 mg by mouth daily. Patient not taking: Reported on 08/02/2020    [provider]  b complex vitamins tablet Take 1 tablet by mouth daily.     [provider]  Bioflavonoid Products (ESTER  C PO) Take 1,000 mg by mouth in the morning and at bedtime. Patient not taking: Reported on 08/02/2020    [provider]  BORON PO Take 5 mg by mouth in the morning and at bedtime. Patient not taking: Reported on 08/02/2020    [provider]  Boswellia Serrata (BOSWELLIA PO) Take 800 mg by mouth in the morning and at bedtime. Patient not taking: Reported on 08/02/2020    [provider]  Cats Claw 400 MG CAPS Take 400 mg by mouth daily. Patient not taking: Reported on 08/02/2020    [provider]  Cholecalciferol (VITAMIN D) 125 MCG (5000 UT) CAPS Take 5,000 Units by mouth daily. Patient not taking: Reported on 08/02/2020    [provider]  Citrulline (CITRULLINE 1000) 1 g PACK Take 2 g by mouth in the morning and at bedtime.     [provider]  COLLAGEN PO Take 1 g by mouth in the morning and at bedtime. Patient not taking: Reported on 08/02/2020    [provider]  CREON 36000-114000 units CPEP capsule Take 2 capsules by mouth See admin instructions. Take 2 capsule by mouth 3 times daily with meals and 1 capsule by mouth with snack. 12/11/18   [provider]  famotidine (PEPCID) 20 MG tablet Take 1 tablet (20 mg total) by mouth at bedtime. Patient  not taking: Reported on 05/20/2020 12/09/18 12/09/19  Nita Sickle, MD  Glecaprevir-Pibrentasvir (MAVYRET PO) Take by mouth. Patient not taking: Reported on 05/20/2020    [provider]  levOCARNitine (CARNITINE, L,) POWD Take 500 mg by mouth daily.     [provider]  lisinopril (ZESTRIL) 10 MG tablet TK 1 T PO QAM Patient not taking: Reported on 03/17/2020 04/20/19   [provider]  Maca 500 MG CAPS Take 500 mg by mouth in the morning, at noon, and at bedtime. Patient not taking: Reported on 08/02/2020    [provider]  MAGNESIUM CITRATE PO Take 2,500 mg by mouth in the morning and at bedtime. Patient not taking: Reported on 08/02/2020     [provider]  Methylcobalamin (METHYL B-12 PO) Take 1,000 mg by mouth in the morning and at bedtime. Patient not taking: Reported on 08/02/2020    [provider]  MILK THISTLE EXTRACT PO Take 1 capsule by mouth in the morning and at bedtime. Patient not taking: Reported on 08/02/2020    [provider]  Misc Natural Products (BETA-SITOSTEROL PLANT STEROLS) CAPS Take 1 capsule by mouth daily. Patient not taking: Reported on 08/02/2020    [provider]  Multiple Vitamin (MULTIVITAMIN WITH MINERALS) TABS tablet Take 1 tablet by mouth daily.     [provider]  Multiple Vitamins-Minerals (PRESERVISION AREDS 2 PO) Take 1 capsule by mouth in the morning and at bedtime. Patient not taking: Reported on 08/02/2020    [provider]  Nettle, Urtica Dioica, (NETTLE LEAF) 435 MG CAPS Take by mouth. Patient not taking: Reported on 08/02/2020    [provider]  ondansetron (ZOFRAN ODT) 4 MG disintegrating tablet Take 1 tablet (4 mg total) by mouth every 8 (eight) hours as needed. Patient not taking: Reported on 07/23/2019 12/11/18   Rockne Menghini, MD  OVER THE COUNTER MEDICATION Take 1 capsule by mouth in the morning and at bedtime. Adaptogenic Complex Patient not taking: Reported on 08/02/2020    [provider]  OVER THE COUNTER MEDICATION Take 500 mg by mouth in the morning, at noon, and at bedtime. D Ribose Patient not taking: Reported on 08/02/2020    [provider]  oxymetazoline (AFRIN) 0.05 % nasal spray Place 1 spray into both nostrils 2 (two) times daily as needed for congestion.  Patient not taking: Reported on 08/02/2020    [provider]  pantoprazole (PROTONIX) 40 MG tablet  03/24/19   [provider]  Potassium Citrate 99 MG CAPS Take 99 mg by mouth daily. Patient not taking: Reported on 08/02/2020    [provider]  RESVERATROL PO Take 500-1,000 mg by mouth See admin  instructions. Take 1000 mg by mouth in the morning and 500 mg at night Patient not taking: Reported on 08/02/2020    [provider]  rOPINIRole (REQUIP) 0.25 MG tablet  03/30/19   [provider]  RUTIN PO Take 1 tablet by mouth in the morning and at bedtime. Patient not taking: Reported on 08/02/2020    [provider]  Sodium Hyaluronate, oral, (HYALURONIC ACID) 100 MG CAPS Take 250 mg by mouth every morning. Patient not taking: Reported on 08/02/2020    [provider]  Thiamine Mononitrate (B1 PO) Take 300 mg by mouth daily. Patient not taking: Reported on 08/02/2020    [provider]  traMADol-acetaminophen (ULTRACET) 37.5-325 MG tablet TK 1 T PO Q 6 H Patient not taking: Reported on 03/17/2020 02/25/19  [provider]  Zinc Picolinate 25 MG TABS Take 25 mg by mouth daily. Patient not taking: Reported on 08/02/2020    [provider]     ALLERGIES:  Allergies  Allergen Reactions   Pregabalin Other (See Comments)    "Violent seizures"     SOCIAL HISTORY:  Social History   Socioeconomic History   Marital status: Single    Spouse name: Not on file   Number of children: Not on file   Years of education: Not on file   Highest education level: Not on file  Occupational History   Not on file  Tobacco Use   Smoking status: Some Days    Packs/day: 1.50    Years: 49.00    Pack years: 73.50    Types: Pipe, Cigarettes    Last attempt to quit: 2019    Years since quitting: 3.7   Smokeless tobacco: Never  Vaping Use   Vaping Use: Never used  Substance and Sexual Activity   Alcohol use: Not Currently   Drug use: Not Currently    Types: Marijuana   Sexual activity: Not on file  Other Topics Concern   Not on file  Social History Narrative   Not on file   Social Determinants of Health   Financial Resource Strain: Not on file  Food Insecurity: Not on file  Transportation Needs: Not on file  Physical Activity: Not on  file  Stress: Not on file  Social Connections: Not on file  Intimate Partner Violence: Not on file      FAMILY HISTORY:  Family History  Problem Relation Age of Onset   Diabetes Mother    Osteoporosis Mother    Suicidality Father      REVIEW OF SYSTEMS:  Constitutional: denies weight loss, fever, chills, or sweats  Eyes: denies any other vision changes, history of eye injury  ENT: denies sore throat, hearing problems  Respiratory: denies shortness of breath, wheezing  Cardiovascular: denies chest pain, palpitations  Gastrointestinal: abdominal pain, nausea and vomiting Genitourinary: denies burning with urination or urinary frequency Musculoskeletal: denies any other joint pains or cramps  Skin: denies any other rashes or skin discolorations  Neurological: denies any other headache, dizziness, weakness  Psychiatric: denies any other depression, anxiety   All other review of systems were negative   VITAL SIGNS:  Temp:  [97.7 F (36.5 C)] 97.7 F (36.5 C) (09/21 1530) Pulse Rate:  [88] 88 (09/21 1530) Resp:  [24] 24 (09/21 1530) BP: (182)/(122) 182/122 (09/21 1530) SpO2:  [99 %] 99 % (09/21 1530)             INTAKE/OUTPUT:  This shift: No intake/output data recorded.  Last 2 shifts: @IOLAST2SHIFTS @   PHYSICAL EXAM:  Constitutional:  -- Normal body habitus  -- Awake, alert, and oriented x3  Eyes:  -- Pupils equally round and reactive to light  -- No scleral icterus  Ear, nose, and throat:  -- No jugular venous distension  Pulmonary:  -- No crackles  -- Equal breath sounds bilaterally -- Breathing non-labored at rest Cardiovascular:  -- S1, S2 present  -- No pericardial rubs Gastrointestinal:  -- Abdomen soft, nontender, non-distended, no guarding or rebound tenderness -- Bilateral inguinal hernia that are reducible.  Right inguinal hernia bigger than left inguinal hernia.  Right inguinal hernia was reduced. Musculoskeletal and Integumentary:  -- Wounds:  None appreciated -- Extremities: B/L UE and LE FROM, hands and feet warm, no edema  Neurologic:  -- Motor  function: intact and symmetric -- Sensation: intact and symmetric   Labs:  CBC Latest Ref Rng & Units 06/14/2021 08/02/2020 08/02/2020  WBC 4.0 - 10.5 K/uL 12.6(H) 13.5(H) 17.8(H)  Hemoglobin 13.0 - 17.0 g/dL 38.1 12.0(L) 13.1  Hematocrit 39.0 - 52.0 % 41.7 35.4(L) 38.2(L)  Platelets 150 - 400 K/uL 266 198 225   CMP Latest Ref Rng & Units 06/14/2021 08/02/2020 08/02/2020  Glucose 70 - 99 mg/dL 829(H) 96 371(I)  BUN 8 - 23 mg/dL 20 18 23   Creatinine 0.61 - 1.24 mg/dL 9.67 8.93  Sodium 135 - 145 mmol/L 138 139 140  Potassium 3.5 - 5.1 mmol/L 3.4(L) 3.6 3.2(L)  Chloride 98 - 111 mmol/L 103 105 105  CO2 22 - 32 mmol/L 23 25 22   Calcium 8.9 - 10.3 mg/dL 9.8 8.10) 9.5  Total Protein 6.5 - 8.1 g/dL - - 7.6  Total Bilirubin 0.3 - 1.2 mg/dL - - 1.0  Alkaline Phos 38 - 126 U/L - - 72  AST 15 - 41 U/L - - 28  ALT 0 - 44 U/L - - 15    Imaging studies:  EXAM: CT ABDOMEN AND PELVIS WITH CONTRAST   TECHNIQUE: Multidetector CT imaging of the abdomen and pelvis was performed using the standard protocol following bolus administration of intravenous contrast.   CONTRAST:  OMNIPAQUE IOHEXOL 350 MG/ML SOLN   COMPARISON:  CT scan 08/02/2020   FINDINGS: Lower chest: The lung bases are clear of acute process. No pleural effusion or pulmonary lesions. The heart is normal in size. No pericardial effusion. The distal esophagus and aorta are unremarkable. Stable aortic and coronary artery calcifications.   Hepatobiliary: No hepatic lesions or intrahepatic biliary dilatation. The gallbladder is unremarkable. No common bile duct dilatation.   Pancreas: Stable scattered calcifications throughout the pancreas consistent with chronic calcific pancreatitis. No acute inflammation, mass lesion or ductal dilatation. Stable small cystic lesion in the pancreatic tail.   Spleen: Normal  size.  No focal lesions.   Adrenals/Urinary Tract: Adrenal glands are unremarkable.   Stable large upper pole left renal cyst. No worrisome renal lesions or hydronephrosis. The bladder is unremarkable.   Stomach/Bowel: Stomach and duodenum are unremarkable. The proximal and mid small bowel loops appear normal. The mid distal small bowel loops are slightly dilated and fluid-filled with scattered air-fluid levels suggesting early or partial small bowel obstruction. This is due to an incarcerated right inguinal hernia. The small bowel loop in the hernia demonstrates mild wall thickening and moderate inflammation/fluid extending down into the scrotum. The loop of bowel coming out of the hernia is the mid distal ileum and it is normal/decompressed.   The colon is unremarkable. Scattered diverticulosis but no evidence of acute diverticulitis.   Vascular/Lymphatic: Advanced atherosclerotic calcifications involving the aorta and iliac arteries. No aneurysm or dissection. The major venous structures are patent. No mesenteric or retroperitoneal mass or adenopathy.   Reproductive: The prostate gland and seminal vesicles are unremarkable.   Other: No free fluid or free air is identified.   Musculoskeletal: No significant bony findings.   IMPRESSION: 1. Incarcerated right inguinal hernia with associated early or partial distal small bowel obstruction. 2. Stable chronic calcific pancreatitis. 3. Advanced atherosclerotic calcifications involving the aorta and iliac arteries. 4. Aortic atherosclerosis.   Aortic Atherosclerosis (ICD10-I70.0).     Electronically Signed   By: M.D.   On: 06/14/2021 16:45  Assessment/Plan:  70 y.o. male with bilateral inguinal hernia, complicated by pertinent comorbidities including GERD,  hypertension.  Patient with initial concern of incarcerated right groin hernia.  Upon physical exam the right inguinal hernia was able to be reduced.  Due  to increased pain I offered the patient to admit him for surgical management of the inguinal hernia but patient endorses that he cannot afford an admission.  My second this option was to offer him outpatient bilateral inguinal hernia repair tomorrow.  Since the hernia is reduced, soft, abdomen is soft, and no sign of strangulation I think that is reasonable for patient to go home tonight and come tomorrow for bilateral inguinal hernia repair.  Patient reports that he preferred just option due to economic reasons.  Patient understand the risk of surgery and agreed to proceed.   Keisha Amer Cintrn-Daz, MD 

## 2021-06-14 NOTE — Consult Note (Signed)
SURGICAL CONSULTATION NOTE   HISTORY OF PRESENT ILLNESS (HPI):  70 y.o. male presented to [facility] ED for evaluation of right inguinal hernia. Patient reports this morning his hernia got really big and harder than usual.  He had a lot of pain.  Pain localized to the right groin.  No pain radiation.  Aggravating factor was ambulating.  No alleviating factors.  At the ED he had a CT scan that shows bilateral ankle hernia with right hernia with intestinal content.  I personally evaluated the images.  Upon evaluation at the ED patient was of right inguinal hernia that was able to be reduced.  Right groin soft without any skin changes.  Patient feeling comfortable.  Patient without any nausea or vomiting.  Surgery is consulted by Dr. Sidney Ace in this context for evaluation and management of right inguinal hernia.  PAST MEDICAL HISTORY (PMH):  Past Medical History:  Diagnosis Date   Anemia    Hepatitis C    Hypertension    Neuropathy    Pancreatitis    Seizures (HCC)    stated had a seizure after he took lyrica      PAST SURGICAL HISTORY (PSH):  Past Surgical History:  Procedure Laterality Date   NO PAST SURGERIES       MEDICATIONS:  Prior to Admission medications   Medication Sig Start Date End Date Taking? Authorizing Provider  Acetylcarnitine HCl (ACETYL L-CARNITINE PO) Take 600 mg by mouth daily. Patient not taking: Reported on 08/02/2020    [provider]  Alpha-Lipoic Acid 300 MG TABS Take 300 mg by mouth in the morning and at bedtime. Patient not taking: Reported on 08/02/2020    [provider]  Arginine 2000 MG PACK Take 2 g by mouth in the morning and at bedtime. Patient not taking: Reported on 08/02/2020    [provider]  ASTAXANTHIN PO Take 10 mg by mouth daily. Patient not taking: Reported on 08/02/2020    [provider]  b complex vitamins tablet Take 1 tablet by mouth daily.     [provider]  Bioflavonoid Products (ESTER C  PO) Take 1,000 mg by mouth in the morning and at bedtime. Patient not taking: Reported on 08/02/2020    [provider]  BORON PO Take 5 mg by mouth in the morning and at bedtime. Patient not taking: Reported on 08/02/2020    [provider]  Boswellia Serrata (BOSWELLIA PO) Take 800 mg by mouth in the morning and at bedtime. Patient not taking: Reported on 08/02/2020    [provider]  Cats Claw 400 MG CAPS Take 400 mg by mouth daily. Patient not taking: Reported on 08/02/2020    [provider]  Cholecalciferol (VITAMIN D) 125 MCG (5000 UT) CAPS Take 5,000 Units by mouth daily. Patient not taking: Reported on 08/02/2020    [provider]  Citrulline (CITRULLINE 1000) 1 g PACK Take 2 g by mouth in the morning and at bedtime.     [provider]  COLLAGEN PO Take 1 g by mouth in the morning and at bedtime. Patient not taking: Reported on 08/02/2020    [provider]  CREON 36000-114000 units CPEP capsule Take 2 capsules by mouth See admin instructions. Take 2 capsule by mouth 3 times daily with meals and 1 capsule by mouth with snack. 12/11/18   [provider]  famotidine (PEPCID) 20 MG tablet Take 1 tablet (20 mg total) by mouth at bedtime. Patient not taking:  Reported on 05/20/2020 12/09/18 12/09/19  Nita Sickle, MD  Glecaprevir-Pibrentasvir (MAVYRET PO) Take by mouth. Patient not taking: Reported on 05/20/2020    [provider]  levOCARNitine (CARNITINE, L,) POWD Take 500 mg by mouth daily.     [provider]  lisinopril (ZESTRIL) 10 MG tablet TK 1 T PO QAM Patient not taking: Reported on 03/17/2020 04/20/19   [provider]  Maca 500 MG CAPS Take 500 mg by mouth in the morning, at noon, and at bedtime. Patient not taking: Reported on 08/02/2020    [provider]  MAGNESIUM CITRATE PO Take 2,500 mg by mouth in the morning and at bedtime. Patient not taking: Reported on 08/02/2020     [provider]  Methylcobalamin (METHYL B-12 PO) Take 1,000 mg by mouth in the morning and at bedtime. Patient not taking: Reported on 08/02/2020    [provider]  MILK THISTLE EXTRACT PO Take 1 capsule by mouth in the morning and at bedtime. Patient not taking: Reported on 08/02/2020    [provider]  Misc Natural Products (BETA-SITOSTEROL PLANT STEROLS) CAPS Take 1 capsule by mouth daily. Patient not taking: Reported on 08/02/2020    [provider]  Multiple Vitamin (MULTIVITAMIN WITH MINERALS) TABS tablet Take 1 tablet by mouth daily.     [provider]  Multiple Vitamins-Minerals (PRESERVISION AREDS 2 PO) Take 1 capsule by mouth in the morning and at bedtime. Patient not taking: Reported on 08/02/2020    [provider]  Nettle, Urtica Dioica, (NETTLE LEAF) 435 MG CAPS Take by mouth. Patient not taking: Reported on 08/02/2020    [provider]  ondansetron (ZOFRAN ODT) 4 MG disintegrating tablet Take 1 tablet (4 mg total) by mouth every 8 (eight) hours as needed. Patient not taking: Reported on 07/23/2019 12/11/18   Rockne Menghini, MD  OVER THE COUNTER MEDICATION Take 1 capsule by mouth in the morning and at bedtime. Adaptogenic Complex Patient not taking: Reported on 08/02/2020    [provider]  OVER THE COUNTER MEDICATION Take 500 mg by mouth in the morning, at noon, and at bedtime. D Ribose Patient not taking: Reported on 08/02/2020    [provider]  oxymetazoline (AFRIN) 0.05 % nasal spray Place 1 spray into both nostrils 2 (two) times daily as needed for congestion.  Patient not taking: Reported on 08/02/2020    [provider]  pantoprazole (PROTONIX) 40 MG tablet  03/24/19   [provider]  Potassium Citrate 99 MG CAPS Take 99 mg by mouth daily. Patient not taking: Reported on 08/02/2020    [provider]  RESVERATROL PO Take 500-1,000 mg by mouth See admin  instructions. Take 1000 mg by mouth in the morning and 500 mg at night Patient not taking: Reported on 08/02/2020    [provider]  rOPINIRole (REQUIP) 0.25 MG tablet  03/30/19   [provider]  RUTIN PO Take 1 tablet by mouth in the morning and at bedtime. Patient not taking: Reported on 08/02/2020    [provider]  Sodium Hyaluronate, oral, (HYALURONIC ACID) 100 MG CAPS Take 250 mg by mouth every morning. Patient not taking: Reported on 08/02/2020    [provider]  Thiamine Mononitrate (B1 PO) Take 300 mg by mouth daily. Patient not taking: Reported on 08/02/2020    [provider]  traMADol-acetaminophen (ULTRACET) 37.5-325 MG tablet TK 1 T PO Q 6 H Patient not taking: Reported on 03/17/2020 02/25/19  [provider]  Zinc Picolinate 25 MG TABS Take 25 mg by mouth daily. Patient not taking: Reported on 08/02/2020    [provider]     ALLERGIES:  Allergies  Allergen Reactions   Pregabalin Other (See Comments)    "Violent seizures"     SOCIAL HISTORY:  Social History   Socioeconomic History   Marital status: Single    Spouse name: Not on file   Number of children: Not on file   Years of education: Not on file   Highest education level: Not on file  Occupational History   Not on file  Tobacco Use   Smoking status: Some Days    Packs/day: 1.50    Years: 49.00    Pack years: 73.50    Types: Pipe, Cigarettes    Last attempt to quit: 2019    Years since quitting: 3.7   Smokeless tobacco: Never  Vaping Use   Vaping Use: Never used  Substance and Sexual Activity   Alcohol use: Not Currently   Drug use: Not Currently    Types: Marijuana   Sexual activity: Not on file  Other Topics Concern   Not on file  Social History Narrative   Not on file   Social Determinants of Health   Financial Resource Strain: Not on file  Food Insecurity: Not on file  Transportation Needs: Not on file  Physical Activity: Not on  file  Stress: Not on file  Social Connections: Not on file  Intimate Partner Violence: Not on file      FAMILY HISTORY:  Family History  Problem Relation Age of Onset   Diabetes Mother    Osteoporosis Mother    Suicidality Father      REVIEW OF SYSTEMS:  Constitutional: denies weight loss, fever, chills, or sweats  Eyes: denies any other vision changes, history of eye injury  ENT: denies sore throat, hearing problems  Respiratory: denies shortness of breath, wheezing  Cardiovascular: denies chest pain, palpitations  Gastrointestinal: abdominal pain, nausea and vomiting Genitourinary: denies burning with urination or urinary frequency Musculoskeletal: denies any other joint pains or cramps  Skin: denies any other rashes or skin discolorations  Neurological: denies any other headache, dizziness, weakness  Psychiatric: denies any other depression, anxiety   All other review of systems were negative   VITAL SIGNS:  Temp:  [97.7 F (36.5 C)] 97.7 F (36.5 C) (09/21 1530) Pulse Rate:  [88] 88 (09/21 1530) Resp:  [24] 24 (09/21 1530) BP: (182)/(122) 182/122 (09/21 1530) SpO2:  [99 %] 99 % (09/21 1530)             INTAKE/OUTPUT:  This shift: No intake/output data recorded.  Last 2 shifts: @IOLAST2SHIFTS @   PHYSICAL EXAM:  Constitutional:  -- Normal body habitus  -- Awake, alert, and oriented x3  Eyes:  -- Pupils equally round and reactive to light  -- No scleral icterus  Ear, nose, and throat:  -- No jugular venous distension  Pulmonary:  -- No crackles  -- Equal breath sounds bilaterally -- Breathing non-labored at rest Cardiovascular:  -- S1, S2 present  -- No pericardial rubs Gastrointestinal:  -- Abdomen soft, nontender, non-distended, no guarding or rebound tenderness -- Bilateral inguinal hernia that are reducible.  Right inguinal hernia bigger than left inguinal hernia.  Right inguinal hernia was reduced. Musculoskeletal and Integumentary:  -- Wounds:  None appreciated -- Extremities: B/L UE and LE FROM, hands and feet warm, no edema  Neurologic:  -- Motor  function: intact and symmetric -- Sensation: intact and symmetric   Labs:  CBC Latest Ref Rng & Units 06/14/2021 08/02/2020 08/02/2020  WBC 4.0 - 10.5 K/uL 12.6(H) 13.5(H) 17.8(H)  Hemoglobin 13.0 - 17.0 g/dL 62.1 12.0(L) 13.1  Hematocrit 39.0 - 52.0 % 41.7 35.4(L) 38.2(L)  Platelets 150 - 400 K/uL 266 198 225   CMP Latest Ref Rng & Units 06/14/2021 08/02/2020 08/02/2020  Glucose 70 - 99 mg/dL 308(M) 96 578(I)  BUN 8 - 23 mg/dL Creatinine 0.61 - 1.24 mg/dL 6.96 2.95 2.84  Sodium 135 - 145 mmol/L 138 139 140  Potassium 3.5 - 5.1 mmol/L 3.4(L) 3.6 3.2(L)  Chloride 98 - 111 mmol/L 103 105 105  CO2 22 - 32 mmol/L Calcium 8.9 - 10.3 mg/dL 9.8 1.3(K) 9.5  Total Protein 6.5 - 8.1 g/dL - - 7.6  Total Bilirubin 0.3 - 1.2 mg/dL - - 1.0  Alkaline Phos 38 - 126 U/L - - 72  AST 15 - 41 U/L - - 28  ALT 0 - 44 U/L - - 15    Imaging studies:  EXAM: CT ABDOMEN AND PELVIS WITH CONTRAST   TECHNIQUE: Multidetector CT imaging of the abdomen and pelvis was performed using the standard protocol following bolus administration of intravenous contrast.   CONTRAST:  OMNIPAQUE IOHEXOL 350 MG/ML SOLN   COMPARISON:  CT scan 08/02/2020   FINDINGS: Lower chest: The lung bases are clear of acute process. No pleural effusion or pulmonary lesions. The heart is normal in size. No pericardial effusion. The distal esophagus and aorta are unremarkable. Stable aortic and coronary artery calcifications.   Hepatobiliary: No hepatic lesions or intrahepatic biliary dilatation. The gallbladder is unremarkable. No common bile duct dilatation.   Pancreas: Stable scattered calcifications throughout the pancreas consistent with chronic calcific pancreatitis. No acute inflammation, mass lesion or ductal dilatation. Stable small cystic lesion in the pancreatic tail.   Spleen: Normal  size.  No focal lesions.   Adrenals/Urinary Tract: Adrenal glands are unremarkable.   Stable large upper pole left renal cyst. No worrisome renal lesions or hydronephrosis. The bladder is unremarkable.   Stomach/Bowel: Stomach and duodenum are unremarkable. The proximal and mid small bowel loops appear normal. The mid distal small bowel loops are slightly dilated and fluid-filled with scattered air-fluid levels suggesting early or partial small bowel obstruction. This is due to an incarcerated right inguinal hernia. The small bowel loop in the hernia demonstrates mild wall thickening and moderate inflammation/fluid extending down into the scrotum. The loop of bowel coming out of the hernia is the mid distal ileum and it is normal/decompressed.   The colon is unremarkable. Scattered diverticulosis but no evidence of acute diverticulitis.   Vascular/Lymphatic: Advanced atherosclerotic calcifications involving the aorta and iliac arteries. No aneurysm or dissection. The major venous structures are patent. No mesenteric or retroperitoneal mass or adenopathy.   Reproductive: The prostate gland and seminal vesicles are unremarkable.   Other: No free fluid or free air is identified.   Musculoskeletal: No significant bony findings.   IMPRESSION: 1. Incarcerated right inguinal hernia with associated early or partial distal small bowel obstruction. 2. Stable chronic calcific pancreatitis. 3. Advanced atherosclerotic calcifications involving the aorta and iliac arteries. 4. Aortic atherosclerosis.   Aortic Atherosclerosis (ICD10-I70.0).     Electronically Signed   By: Rudie Meyer M.D.   On: 06/14/2021 16:45  Assessment/Plan:  70 y.o. male with right middle hernia, complicated by pertinent comorbidities including GERD,  hypertension.  Patient with initial concern of incarcerated right groin hernia.  Upon physical exam the right inguinal hernia was able to be reduced.  Due to  increased pain I offered the patient to admit him for surgical management of the inguinal hernia but patient endorses that he cannot afford an admission.  My second this option was to offer him outpatient bilateral inguinal hernia repair tomorrow.  Since the hernia is reduced, soft, abdomen is soft, and no sign of strangulation I think that is reasonable for patient to go home tonight and come tomorrow for bilateral inguinal hernia repair.  Patient reports that he preferred just option due to economic reasons.  Patient understand the risk of surgery and agreed to proceed.   Gae Gallop, MD

## 2021-06-14 NOTE — Discharge Instructions (Signed)
You are scheduled to have surgery on your hernia tomorrow at 1230.  You must arrive here by 11 AM.  You will have surgery with Dr. Maia Plan.  Please do not eat or drink anything after 12 AM or you will not be able to have the surgery.

## 2021-06-15 ENCOUNTER — Other Ambulatory Visit: Payer: Self-pay

## 2021-06-15 ENCOUNTER — Encounter: Payer: Self-pay | Admitting: General Surgery

## 2021-06-15 ENCOUNTER — Encounter
Admission: RE | Disposition: A | Discharge status: Home / Self Care | Payer: Self-pay | Source: Home / Self Care | Attending: General Surgery

## 2021-06-15 ENCOUNTER — Ambulatory Visit
Admission: RE | Admit: 2021-06-15 | Discharge: 2021-06-15 | Disposition: A | Discharge status: Home / Self Care | Payer: Medicare Other | Attending: General Surgery | Admitting: General Surgery

## 2021-06-15 ENCOUNTER — Ambulatory Visit: Payer: Medicare Other | Admitting: Anesthesiology

## 2021-06-15 DIAGNOSIS — K219 Gastro-esophageal reflux disease without esophagitis: Secondary | ICD-10-CM | POA: Insufficient documentation

## 2021-06-15 DIAGNOSIS — F17210 Nicotine dependence, cigarettes, uncomplicated: Secondary | ICD-10-CM | POA: Diagnosis not present

## 2021-06-15 DIAGNOSIS — K4020 Bilateral inguinal hernia, without obstruction or gangrene, not specified as recurrent: Secondary | ICD-10-CM | POA: Diagnosis present

## 2021-06-15 DIAGNOSIS — Z888 Allergy status to other drugs, medicaments and biological substances status: Secondary | ICD-10-CM | POA: Diagnosis not present

## 2021-06-15 DIAGNOSIS — I10 Essential (primary) hypertension: Secondary | ICD-10-CM | POA: Insufficient documentation

## 2021-06-15 DIAGNOSIS — Z79899 Other long term (current) drug therapy: Secondary | ICD-10-CM | POA: Diagnosis not present

## 2021-06-15 HISTORY — PX: LYMPH NODE BIOPSY: SHX201

## 2021-06-15 HISTORY — PX: REPAIR, HERNIA, INGUINAL, BILATERAL, ROBOT-ASSISTED: SHX7636

## 2021-06-15 HISTORY — PX: INSERTION OF MESH: SHX5868

## 2021-06-15 SURGERY — REPAIR, HERNIA, INGUINAL, BILATERAL, ROBOT-ASSISTED
Anesthesia: General | Site: Inguinal | Laterality: Left

## 2021-06-15 MED ORDER — FENTANYL CITRATE (PF) 100 MCG/2ML IJ SOLN
INTRAMUSCULAR | Status: AC
  Filled 2021-06-15: qty 2

## 2021-06-15 MED ORDER — ROCURONIUM BROMIDE 100 MG/10ML IV SOLN
INTRAVENOUS | Status: DC | PRN
  Administered 2021-06-15: 50 mg via INTRAVENOUS
  Administered 2021-06-15: 20 mg via INTRAVENOUS

## 2021-06-15 MED ORDER — CHLORHEXIDINE GLUCONATE 0.12 % MT SOLN
OROMUCOSAL | Status: AC
  Administered 2021-06-15: 15 mL via OROMUCOSAL
  Filled 2021-06-15: qty 15

## 2021-06-15 MED ORDER — LABETALOL HCL 5 MG/ML IV SOLN
INTRAVENOUS | Status: DC | PRN
  Administered 2021-06-15: 5 mg via INTRAVENOUS

## 2021-06-15 MED ORDER — FENTANYL CITRATE (PF) 100 MCG/2ML IJ SOLN
INTRAMUSCULAR | Status: AC
  Administered 2021-06-15: 50 ug via INTRAVENOUS
  Filled 2021-06-15: qty 2

## 2021-06-15 MED ORDER — HYDROMORPHONE HCL 1 MG/ML IJ SOLN: 1.0000 mg | INTRAMUSCULAR | Status: DC | PRN

## 2021-06-15 MED ORDER — MIDAZOLAM HCL 2 MG/2ML IJ SOLN
INTRAMUSCULAR | Status: AC
  Filled 2021-06-15: qty 2

## 2021-06-15 MED ORDER — HYDROCODONE-ACETAMINOPHEN 5-325 MG PO TABS: 1.0000 | ORAL_TABLET | ORAL | 0 refills | Status: AC | PRN

## 2021-06-15 MED ORDER — MIDAZOLAM HCL 2 MG/2ML IJ SOLN
INTRAMUSCULAR | Status: DC | PRN
  Administered 2021-06-15: 2 mg via INTRAVENOUS

## 2021-06-15 MED ORDER — FAMOTIDINE 20 MG PO TABS
ORAL_TABLET | ORAL | Status: AC
  Administered 2021-06-15: 20 mg
  Filled 2021-06-15: qty 1

## 2021-06-15 MED ORDER — HYDRALAZINE HCL 20 MG/ML IJ SOLN
INTRAMUSCULAR | Status: AC
  Administered 2021-06-15: 10 mg via INTRAVENOUS
  Filled 2021-06-15: qty 1

## 2021-06-15 MED ORDER — HYDRALAZINE HCL 20 MG/ML IJ SOLN
10.0000 mg | Freq: Once | INTRAMUSCULAR | Status: AC
  Filled 2021-06-15: qty 0.5

## 2021-06-15 MED ORDER — ONDANSETRON HCL 4 MG/2ML IJ SOLN
INTRAMUSCULAR | Status: AC
  Filled 2021-06-15: qty 4

## 2021-06-15 MED ORDER — CHLORHEXIDINE GLUCONATE 0.12 % MT SOLN: 15.0000 mL | Freq: Once | OROMUCOSAL | Status: AC

## 2021-06-15 MED ORDER — PROMETHAZINE HCL 25 MG/ML IJ SOLN: 6.2500 mg | INTRAMUSCULAR | Status: DC | PRN

## 2021-06-15 MED ORDER — DEXAMETHASONE SODIUM PHOSPHATE 10 MG/ML IJ SOLN
INTRAMUSCULAR | Status: DC | PRN
  Administered 2021-06-15: 10 mg via INTRAVENOUS

## 2021-06-15 MED ORDER — OXYCODONE HCL 5 MG PO TABS: 5.0000 mg | ORAL_TABLET | Freq: Once | ORAL | Status: AC | PRN

## 2021-06-15 MED ORDER — ONDANSETRON HCL 4 MG/2ML IJ SOLN
INTRAMUSCULAR | Status: DC | PRN
  Administered 2021-06-15: 4 mg via INTRAVENOUS

## 2021-06-15 MED ORDER — ORAL CARE MOUTH RINSE: 15.0000 mL | Freq: Once | OROMUCOSAL | Status: AC

## 2021-06-15 MED ORDER — SEVOFLURANE IN SOLN
RESPIRATORY_TRACT | Status: AC
  Filled 2021-06-15: qty 250

## 2021-06-15 MED ORDER — PROPOFOL 10 MG/ML IV BOLUS
INTRAVENOUS | Status: DC | PRN
  Administered 2021-06-15: 150 mg via INTRAVENOUS

## 2021-06-15 MED ORDER — BUPIVACAINE-EPINEPHRINE (PF) 0.25% -1:200000 IJ SOLN
INTRAMUSCULAR | Status: AC
  Filled 2021-06-15: qty 30

## 2021-06-15 MED ORDER — FENTANYL CITRATE (PF) 100 MCG/2ML IJ SOLN
25.0000 ug | INTRAMUSCULAR | Status: DC | PRN
  Administered 2021-06-15 (×2): 50 ug via INTRAVENOUS

## 2021-06-15 MED ORDER — BUPIVACAINE-EPINEPHRINE 0.25% -1:200000 IJ SOLN
INTRAMUSCULAR | Status: DC | PRN
  Administered 2021-06-15: 30 mL

## 2021-06-15 MED ORDER — ACETAMINOPHEN 10 MG/ML IV SOLN
INTRAVENOUS | Status: AC
  Filled 2021-06-15: qty 100

## 2021-06-15 MED ORDER — 0.9 % SODIUM CHLORIDE (POUR BTL) OPTIME
TOPICAL | Status: DC | PRN
  Administered 2021-06-15: 500 mL

## 2021-06-15 MED ORDER — OXYCODONE HCL 5 MG/5ML PO SOLN: 5.0000 mg | Freq: Once | ORAL | Status: AC | PRN

## 2021-06-15 MED ORDER — CEFAZOLIN SODIUM-DEXTROSE 2-4 GM/100ML-% IV SOLN
2.0000 g | INTRAVENOUS | Status: AC
  Administered 2021-06-15: 2 g via INTRAVENOUS

## 2021-06-15 MED ORDER — LIDOCAINE HCL (CARDIAC) PF 100 MG/5ML IV SOSY
PREFILLED_SYRINGE | INTRAVENOUS | Status: DC | PRN
Start: 2021-06-15 — End: 2021-06-15
  Administered 2021-06-15: 80 mg via INTRAVENOUS

## 2021-06-15 MED ORDER — CEFAZOLIN SODIUM-DEXTROSE 2-4 GM/100ML-% IV SOLN
INTRAVENOUS | Status: AC
  Filled 2021-06-15: qty 100

## 2021-06-15 MED ORDER — LACTATED RINGERS IV SOLN: INTRAVENOUS | Status: DC

## 2021-06-15 MED ORDER — SODIUM CHLORIDE FLUSH 0.9 % IV SOLN
INTRAVENOUS | Status: AC
  Filled 2021-06-15: qty 10

## 2021-06-15 MED ORDER — HYDROMORPHONE HCL 1 MG/ML IJ SOLN
INTRAMUSCULAR | Status: AC
  Administered 2021-06-15: 1 mg via INTRAVENOUS
  Filled 2021-06-15: qty 1

## 2021-06-15 MED ORDER — ACETAMINOPHEN 10 MG/ML IV SOLN
INTRAVENOUS | Status: DC | PRN
  Administered 2021-06-15: 1000 mg via INTRAVENOUS

## 2021-06-15 MED ORDER — ACETAMINOPHEN 10 MG/ML IV SOLN: 1000.0000 mg | Freq: Once | INTRAVENOUS | Status: DC | PRN

## 2021-06-15 MED ORDER — FENTANYL CITRATE (PF) 100 MCG/2ML IJ SOLN
INTRAMUSCULAR | Status: DC | PRN
  Administered 2021-06-15: 50 ug via INTRAVENOUS
  Administered 2021-06-15: 100 ug via INTRAVENOUS
  Administered 2021-06-15: 50 ug via INTRAVENOUS

## 2021-06-15 MED ORDER — OXYCODONE HCL 5 MG PO TABS
ORAL_TABLET | ORAL | Status: AC
  Administered 2021-06-15: 5 mg via ORAL
  Filled 2021-06-15: qty 1

## 2021-06-15 SURGICAL SUPPLY — 55 items
BAG INFUSER PRESSURE 100CC (MISCELLANEOUS) IMPLANT
BLADE SURG SZ11 CARB STEEL (BLADE) ×4 IMPLANT
CHLORAPREP W/TINT 26 (MISCELLANEOUS) ×4 IMPLANT
COVER TIP SHEARS 8 DVNC (MISCELLANEOUS) ×3 IMPLANT
COVER TIP SHEARS 8MM DA VINCI (MISCELLANEOUS) ×1
COVER WAND RF STERILE (DRAPES) ×4 IMPLANT
DEFOGGER SCOPE WARMER CLEARIFY (MISCELLANEOUS) ×4 IMPLANT
DERMABOND ADVANCED (GAUZE/BANDAGES/DRESSINGS) ×1
DERMABOND ADVANCED .7 DNX12 (GAUZE/BANDAGES/DRESSINGS) ×3 IMPLANT
DRAPE ARM DVNC X/XI (DISPOSABLE) ×9 IMPLANT
DRAPE COLUMN DVNC XI (DISPOSABLE) ×3 IMPLANT
DRAPE DA VINCI XI ARM (DISPOSABLE) ×3
DRAPE DA VINCI XI COLUMN (DISPOSABLE) ×1
ELECT REM PT RETURN 9FT ADLT (ELECTROSURGICAL) ×4
ELECTRODE REM PT RTRN 9FT ADLT (ELECTROSURGICAL) ×3 IMPLANT
GAUZE 4X4 16PLY ~~LOC~~+RFID DBL (SPONGE) ×4 IMPLANT
GLOVE SURG ENC MOIS LTX SZ6.5 (GLOVE) ×8 IMPLANT
GLOVE SURG UNDER POLY LF SZ6.5 (GLOVE) ×8 IMPLANT
GOWN STRL REUS W/ TWL LRG LVL3 (GOWN DISPOSABLE) ×9 IMPLANT
GOWN STRL REUS W/TWL LRG LVL3 (GOWN DISPOSABLE) ×3
IRRIGATOR SUCT 8 DISP DVNC XI (IRRIGATION / IRRIGATOR) IMPLANT
IRRIGATOR SUCTION 8MM XI DISP (IRRIGATION / IRRIGATOR)
IV CATH ANGIO 12GX3 LT BLUE (NEEDLE) IMPLANT
IV NS 1000ML (IV SOLUTION)
IV NS 1000ML BAXH (IV SOLUTION) IMPLANT
KIT PINK PAD W/HEAD ARE REST (MISCELLANEOUS) ×4 IMPLANT
KIT PINK PAD W/HEAD ARM REST (MISCELLANEOUS) ×3 IMPLANT
LABEL OR SOLS (LABEL) IMPLANT
MANIFOLD NEPTUNE II (INSTRUMENTS) ×4 IMPLANT
MESH 3DMAX 5X7 LT XLRG (Mesh General) ×1 IMPLANT
MESH 3DMAX 5X7 RT XLRG (Mesh General) ×1 IMPLANT
MESH 3DMAX MID 5X7 LT XLRG (Mesh General) IMPLANT
MESH 3DMAX MID 5X7 RT XLRG (Mesh General) IMPLANT
NDL INSUFFLATION 14GA 120MM (NEEDLE) ×3 IMPLANT
NEEDLE HYPO 22GX1.5 SAFETY (NEEDLE) ×4 IMPLANT
NEEDLE INSUFFLATION 14GA 120MM (NEEDLE) ×4 IMPLANT
NS IRRIG 500ML POUR BTL (IV SOLUTION) ×1 IMPLANT
OBTURATOR OPTICAL STANDARD 8MM (TROCAR) ×1
OBTURATOR OPTICAL STND 8 DVNC (TROCAR) ×3
OBTURATOR OPTICALSTD 8 DVNC (TROCAR) ×3 IMPLANT
PACK LAP CHOLECYSTECTOMY (MISCELLANEOUS) ×4 IMPLANT
SEAL CANN UNIV 5-8 DVNC XI (MISCELLANEOUS) ×9 IMPLANT
SEAL XI 5MM-8MM UNIVERSAL (MISCELLANEOUS) ×3
SET TUBE SMOKE EVAC HIGH FLOW (TUBING) ×4 IMPLANT
SOLUTION ELECTROLUBE (MISCELLANEOUS) ×4 IMPLANT
SUT MNCRL 4-0 (SUTURE) ×1
SUT MNCRL 4-0 27XMFL (SUTURE) ×3
SUT VIC AB 2-0 SH 27 (SUTURE) ×1
SUT VIC AB 2-0 SH 27XBRD (SUTURE) ×3 IMPLANT
SUT VIC AB 2-0 UR6 27 (SUTURE) ×1 IMPLANT
SUT VLOC 90 S/L VL9 GS22 (SUTURE) ×5 IMPLANT
SUTURE MNCRL 4-0 27XMF (SUTURE) ×3 IMPLANT
TAPE TRANSPORE STRL 2 31045 (GAUZE/BANDAGES/DRESSINGS) IMPLANT
TRAY FOLEY MTR SLVR 16FR STAT (SET/KITS/TRAYS/PACK) ×3 IMPLANT
WATER STERILE IRR 500ML POUR (IV SOLUTION) ×3 IMPLANT

## 2021-06-15 NOTE — Transfer of Care (Signed)
Immediate Anesthesia Transfer of Care Note  Patient: Tom Pace  Procedure(s) Performed: XI ROBOTIC ASSISTED BILATERAL INGUINAL HERNIA (Bilateral: Abdomen) INSERTION OF MESH (Bilateral: Inguinal) Femoral LYMPH NODE BIOPSY (Left: Abdomen)  Patient Location: PACU  Anesthesia Type:General  Level of Consciousness: drowsy  Airway & Oxygen Therapy: Patient Spontanous Breathing and Patient connected to face mask oxygen  Post-op Assessment: Report given to RN and Post -op Vital signs reviewed and stable  Post vital signs: Reviewed and stable  Last Vitals:  Vitals Value Taken Time  BP 191/101 06/15/21 1532  Temp 36.4 C 06/15/21 1526  Pulse 68 06/15/21 1541  Resp 16 06/15/21 1541  SpO2 97 % 06/15/21 1541  Vitals shown include unvalidated device data.  Last Pain:  Vitals:   06/15/21 1526  TempSrc:   PainSc: Asleep         Complications: No notable events documented.

## 2021-06-15 NOTE — Anesthesia Preprocedure Evaluation (Addendum)
Anesthesia Evaluation  Patient identified by MRN, date of birth, ID band Patient awake    Reviewed: Allergy & Precautions, NPO status , Patient's Chart, lab work & pertinent test results  Airway Mallampati: III  TM Distance: >3 FB Neck ROM: full    Dental  (+) Edentulous Upper, Edentulous Lower   Pulmonary Current SmokerPatient did not abstain from smoking.,    Pulmonary exam normal        Cardiovascular Exercise Tolerance: Good hypertension, + Peripheral Vascular Disease  Normal cardiovascular exam Rhythm:Regular Rate:Normal     Neuro/Psych  Neuromuscular disease (Neuropathy upper extremities wit atrophy) negative psych ROS   GI/Hepatic Neg liver ROS, GERD  ,  Endo/Other  negative endocrine ROS  Renal/GU      Musculoskeletal  (+) Arthritis , Osteoarthritis,  Degenerative joint disease (DJD) of lumbar spine  Spinal stenosis of cervical region   Abdominal Normal abdominal exam  (+)   Peds  Hematology negative hematology ROS (+)   Anesthesia Other Findings bilateral inguinal hernia  Reproductive/Obstetrics negative OB ROS                            Anesthesia Physical Anesthesia Plan  ASA: 2  Anesthesia Plan: General ETT   Post-op Pain Management:    Induction: Intravenous  PONV Risk Score and Plan: Ondansetron, Dexamethasone, Midazolam and Treatment may vary due to age or medical condition  Airway Management Planned: Oral ETT  Additional Equipment:   Intra-op Plan:   Post-operative Plan: Extubation in OR  Informed Consent: I have reviewed the patients History and Physical, chart, labs and discussed the procedure including the risks, benefits and alternatives for the proposed anesthesia with the patient or authorized representative who has indicated his/her understanding and acceptance.     Dental advisory given  Plan Discussed with: CRNA and Anesthesiologist  Anesthesia  Plan Comments:        Anesthesia Quick Evaluation

## 2021-06-15 NOTE — Anesthesia Postprocedure Evaluation (Signed)
Anesthesia Post Note  Patient: Tom Pace  Procedure(s) Performed: XI ROBOTIC ASSISTED BILATERAL INGUINAL HERNIA (Bilateral: Abdomen) INSERTION OF MESH (Bilateral: Inguinal) Femoral LYMPH NODE BIOPSY (Left: Abdomen)  Patient location during evaluation: PACU Anesthesia Type: General Level of consciousness: awake and alert Pain management: pain level controlled Vital Signs Assessment: post-procedure vital signs reviewed and stable Respiratory status: spontaneous breathing, nonlabored ventilation, respiratory function stable and patient connected to nasal cannula oxygen Cardiovascular status: blood pressure returned to baseline and stable Postop Assessment: no apparent nausea or vomiting Anesthetic complications: no   No notable events documented.   Last Vitals:  Vitals:   06/15/21 1717 06/15/21 1735  BP: (!) 185/95 (!) 158/69  Pulse: 87 80  Resp: 20 (!) 21  Temp: (!) 36.2 C   SpO2: 99% 97%    Last Pain:  Vitals:   06/15/21 1717  TempSrc: Temporal  PainSc: 5                  Corinda Gubler

## 2021-06-15 NOTE — Anesthesia Procedure Notes (Signed)
Procedure Name: Intubation Date/Time: 06/15/2021 12:53 PM Performed by: Pearla Dubonnet, CRNA Pre-anesthesia Checklist: Patient identified, Emergency Drugs available, Suction available and Patient being monitored Patient Re-evaluated:Patient Re-evaluated prior to induction Oxygen Delivery Method: Circle system utilized Preoxygenation: Pre-oxygenation with 100% oxygen Induction Type: IV induction Ventilation: Oral airway inserted - appropriate to patient size Laryngoscope Size: McGraph and 4 Grade View: Grade I Tube type: Oral Tube size: 7.5 mm Number of attempts: 1 Airway Equipment and Method: Stylet Placement Confirmation: ETT inserted through vocal cords under direct vision, positive ETCO2, CO2 detector and breath sounds checked- equal and bilateral Secured at: 21 cm Tube secured with: Tape Dental Injury: Teeth and Oropharynx as per pre-operative assessment

## 2021-06-15 NOTE — Discharge Instructions (Addendum)
  Diet: Resume home heart healthy regular diet.   Activity: No heavy lifting >20 pounds (children, pets, laundry, garbage) or strenuous activity until follow-up, but light activity and walking are encouraged. Do not drive or drink alcohol if taking narcotic pain medications.  Wound care: May shower with soapy water and pat dry (do not rub incisions), but no baths or submerging incision underwater until follow-up. (no swimming)   Medications: Resume all home medications. For mild to moderate pain: acetaminophen (Tylenol) ***or ibuprofen (if no kidney disease). Combining Tylenol with alcohol can substantially increase your risk of causing liver disease. Narcotic pain medications, if prescribed, can be used for severe pain, though may cause nausea, constipation, and drowsiness. Do not combine Tylenol and Norco within a 6 hour period as Norco contains Tylenol. If you do not need the narcotic pain medication, you do not need to fill the prescription.  Call office ([phone]) at any time if any questions, worsening pain, fevers/chills, bleeding, drainage from incision site, or other concerns.   AMBULATORY SURGERY  DISCHARGE INSTRUCTIONS   The drugs that you were given will stay in your system until tomorrow so for the next 24 hours you should not:  Drive an automobile Make any legal decisions Drink any alcoholic beverage   You may resume regular meals tomorrow.  Today it is better to start with liquids and gradually work up to solid foods.  You may eat anything you prefer, but it is better to start with liquids, then soup and crackers, and gradually work up to solid foods.   Please notify your doctor immediately if you have any unusual bleeding, trouble breathing, redness and pain at the surgery site, drainage, fever, or pain not relieved by medication.    Additional Instructions:        Please contact your physician with any problems or Same Day Surgery at [phone], Monday  through Friday 6 am to 4 pm, or [place] at  Main number at [phone].  

## 2021-06-15 NOTE — Op Note (Signed)
Preoperative diagnosis: Bilateral inguinal hernia.   Postoperative diagnosis: Bilateral inguinal hernia.  Procedure: Robotic assisted Laparoscopic Transabdominal preperitoneal laparoscopic (TAPP) repair of bilateral inguinal hernia. Robotic assisted laparoscopic femoral lymph node excisional biopsy  Anesthesia: GETA  Surgeon: Dr. Hazle Quant  Wound Classification: Clean  Indications:  Patient is a 70 y.o. male developed a symptomatic bilateral inguinal hernia. Repair was indicated.  Findings: 1. Right large Indirect Inguinal hernia identified 2. Left direct inguinal hernia 3. Left femoral hernia with herniated lymph node 4. Vas deferens and cord structures identified and preserved 5. Bard 3D Max mesh used for repair 6. Adequate hemostasis.       Description of procedure: The patient was taken to the operating room and the correct side of surgery was verified. The patient was placed supine with arms tucked at the sides. After obtaining adequate anesthesia, the patient's abdomen was prepped and draped in standard sterile fashion. The patient was placed in the Trendelenburg position. A time-out was completed verifying correct patient, procedure, site, positioning, and implant(s) and/or special equipment prior to beginning this procedure. A Veress needle was placed at the umbilicus and pneumoperitoneum created with insufflation of carbon dioxide to 15 mmHg. After the Veress needle was removed, an 8-mm trocar was placed on epigastric area and the 30 angled laparoscope inserted. Two 8-mm trocars were then placed lateral to the rectus sheath under direct visualization. Both inguinal regions were inspected and the median umbilical ligament, medial umbilical ligament, and lateral umbilical fold were identified.  The robotic arms were docked. The robotic scope was inserted and the pelvic area anatomy targeted.  Right inguinal hernia repaired first. The peritoneum was incised with scissors along a  line 5 cm above the superior edge of the hernia defect, extending from the median umbilical ligament to the anterior superior iliac spine. The peritoneal flap was mobilized inferiorly using blunt and sharp dissection. The inferior epigastric vessels were exposed and the pubic symphysis was identified. Cooper's ligament was dissected to its junction with the iliac vein. The dissection was continued inferiorly to the iliopubic tract, with care taken to avoid injury to the femoral branch of the genitofemoral nerve and the lateral femoral cutaneous nerve. The cord structures were parietalized. The hernia was identified and reduced by gentle traction.  The indirect hernia sac was noted mobilized from the cord structures and reduced into the peritoneal cavity.  A large piece of mesh was rolled longitudinally into a compact cylinder and passed through a trocar. The cylinder was placed along the inferior aspect of the working space and unrolled into place to completely cover the direct, indirect, and femoral spaces. The mesh was secured into place superiorly to the anterior abdominal wall and inferiorly and medially to Cooper's ligament with absorbable sutures. Care was taken to avoid the inferolateral triangles containing the iliac vessels and genital nerves. The peritoneal flap was closed over the mesh and secured with suture in similar positions of safety.  The left inguina hernia was repaired using the same technique. Upon repair or the left inguinal hernia, a left femoral lymph node was seen herniated through the femoral canal. Excisional biopsy was done since multiple enlarge lymph nodes were identified.  After ensuring adequate hemostasis, the trocars were removed and the pneumoperitoneum allowed to escape. The trocar incisions were closed using monocryl and skin adhesive dressings applied.  The patient tolerated the procedure well and was taken to the postanesthesia care unit in stable condition.   Specimen:  Left femoral lymph node  Complications:  None  Estimated Blood Loss: 5 mL

## 2021-06-15 NOTE — ED Provider Notes (Signed)
[facility]  ____________________________________________   Event Date/Time   First MD Initiated Contact with Patient 06/14/21 1532     (approximate)  I have reviewed the triage vital signs and the nursing notes.   HISTORY  Chief Complaint Hernia    HPI Tom Pace is a 70 y.o. male pmh of anemia, inguinal hernia, hep  presents with incarcerated hernia.  He has hx of BL inguinal hernias which have not been an issue for him. 2 hrs PTA pt developed severe R sided groin pain. Could not reduce the hernia. Denies abdominal pain, N/V or fevers. Pain is sharp, 10/10 in severity.          Past Medical History:  Diagnosis Date   Anemia    Hepatitis C    Hypertension    Neuropathy    Pancreatitis    Seizures (HCC)    stated had a seizure after he took lyrica     Patient Active Problem List   Diagnosis Date Noted   SBO (small bowel obstruction) (HCC) 08/02/2020   Abnormal MRI, cervical spine 12/18/2019   Spinal stenosis of cervical region 12/18/2019   Leg pain 07/26/2019   Atherosclerosis of native arteries of extremity with intermittent claudication (HCC) 07/26/2019   Degenerative joint disease (DJD) of lumbar spine 07/26/2019   DJD (degenerative joint disease) of cervical spine 07/26/2019   Essential hypertension 07/26/2019   GERD (gastroesophageal reflux disease) 07/26/2019   Chronic venous insufficiency 07/26/2019   Atrophy of muscle of multiple sites 06/28/2019   Neck pain 06/28/2019   Neuropathy 06/28/2019   Chronic pancreatitis (HCC) 02/25/2019    Past Surgical History:  Procedure Laterality Date   NO PAST SURGERIES      Prior to Admission medications   Medication Sig Start Date End Date Taking? Authorizing Provider  Acetylcarnitine HCl (ACETYL L-CARNITINE PO) Take 600 mg by mouth daily. Patient not taking: Reported on 08/02/2020    [provider]  Alpha-Lipoic Acid 300 MG TABS Take 300 mg by mouth in the morning and at  bedtime. Patient not taking: Reported on 08/02/2020    [provider]  Arginine 2000 MG PACK Take 2 g by mouth in the morning and at bedtime. Patient not taking: Reported on 08/02/2020    [provider]  ASTAXANTHIN PO Take 10 mg by mouth daily. Patient not taking: Reported on 08/02/2020    [provider]  b complex vitamins tablet Take 1 tablet by mouth daily.     [provider]  Bioflavonoid Products (ESTER C PO) Take 1,000 mg by mouth in the morning and at bedtime. Patient not taking: Reported on 08/02/2020    [provider]  BORON PO Take 5 mg by mouth in the morning and at bedtime. Patient not taking: Reported on 08/02/2020    [provider]  Boswellia Serrata (BOSWELLIA PO) Take 800 mg by mouth in the morning and at bedtime. Patient not taking: Reported on 08/02/2020    [provider]  Cats Claw 400 MG CAPS Take 400 mg by mouth daily. Patient not taking: Reported on 08/02/2020    [provider]  Cholecalciferol (VITAMIN D) 125 MCG (5000 UT) CAPS Take 5,000 Units by mouth daily. Patient not taking: Reported on 08/02/2020    [provider]  Citrulline (CITRULLINE 1000) 1 g PACK Take 2 g by mouth in the morning and at bedtime.     [provider]  COLLAGEN PO Take 1 g by  mouth in the morning and at bedtime. Patient not taking: Reported on 08/02/2020    [provider]  CREON 36000-114000 units CPEP capsule Take 2 capsules by mouth See admin instructions. Take 2 capsule by mouth 3 times daily with meals and 1 capsule by mouth with snack. 12/11/18   [provider]  famotidine (PEPCID) 20 MG tablet Take 1 tablet (20 mg total) by mouth at bedtime. Patient not taking: Reported on 05/20/2020 12/09/18 12/09/19  Nita Sickle, MD  Glecaprevir-Pibrentasvir (MAVYRET PO) Take by mouth. Patient not taking: Reported on 05/20/2020    [provider]  levOCARNitine (CARNITINE, L,) POWD  Take 500 mg by mouth daily.     [provider]  lisinopril (ZESTRIL) 10 MG tablet TK 1 T PO QAM Patient not taking: Reported on 03/17/2020 04/20/19   [provider]  Maca 500 MG CAPS Take 500 mg by mouth in the morning, at noon, and at bedtime. Patient not taking: Reported on 08/02/2020    [provider]  MAGNESIUM CITRATE PO Take 2,500 mg by mouth in the morning and at bedtime. Patient not taking: Reported on 08/02/2020    [provider]  Methylcobalamin (METHYL B-12 PO) Take 1,000 mg by mouth in the morning and at bedtime. Patient not taking: Reported on 08/02/2020    [provider]  MILK THISTLE EXTRACT PO Take 1 capsule by mouth in the morning and at bedtime. Patient not taking: Reported on 08/02/2020    [provider]  Misc Natural Products (BETA-SITOSTEROL PLANT STEROLS) CAPS Take 1 capsule by mouth daily. Patient not taking: Reported on 08/02/2020    [provider]  Multiple Vitamin (MULTIVITAMIN WITH MINERALS) TABS tablet Take 1 tablet by mouth daily.     [provider]  Multiple Vitamins-Minerals (PRESERVISION AREDS 2 PO) Take 1 capsule by mouth in the morning and at bedtime. Patient not taking: Reported on 08/02/2020    [provider]  Nettle, Urtica Dioica, (NETTLE LEAF) 435 MG CAPS Take by mouth. Patient not taking: Reported on 08/02/2020    [provider]  ondansetron (ZOFRAN ODT) 4 MG disintegrating tablet Take 1 tablet (4 mg total) by mouth every 8 (eight) hours as needed. Patient not taking: Reported on 07/23/2019 12/11/18   Rockne Menghini, MD  OVER THE COUNTER MEDICATION Take 1 capsule by mouth in the morning and at bedtime. Adaptogenic Complex Patient not taking: Reported on 08/02/2020    [provider]  OVER THE COUNTER MEDICATION Take 500 mg by mouth in the morning, at noon, and at bedtime. D Ribose Patient not taking: Reported on 08/02/2020    [provider]   oxymetazoline (AFRIN) 0.05 % nasal spray Place 1 spray into both nostrils 2 (two) times daily as needed for congestion.  Patient not taking: Reported on 08/02/2020    [provider]  pantoprazole (PROTONIX) 40 MG tablet  03/24/19   [provider]  Potassium Citrate 99 MG CAPS Take 99 mg by mouth daily. Patient not taking: Reported on 08/02/2020    [provider]  RESVERATROL PO Take 500-1,000 mg by mouth See admin instructions. Take 1000 mg by mouth in the morning and 500 mg at night Patient not taking: Reported on 08/02/2020    [provider]  rOPINIRole (REQUIP) 0.25 MG tablet  03/30/19   [provider]  RUTIN PO Take 1 tablet by mouth in the morning and at bedtime. Patient not taking: Reported on 08/02/2020    [provider]  Sodium Hyaluronate, oral, (HYALURONIC ACID) 100 MG CAPS Take 250 mg by mouth every morning. Patient not taking: Reported on 08/02/2020    [provider]  Thiamine Mononitrate (B1 PO) Take 300 mg by mouth daily. Patient not taking: Reported on 08/02/2020    [provider]  traMADol-acetaminophen (ULTRACET) 37.5-325 MG tablet TK 1 T PO Q 6 H Patient not taking: Reported on 03/17/2020 02/25/19   [provider]  Zinc Picolinate 25 MG TABS Take 25 mg by mouth daily. Patient not taking: Reported on 08/02/2020    [provider]    Allergies Pregabalin  Family History  Problem Relation Age of Onset   Diabetes Mother    Osteoporosis Mother    Suicidality Father     Social History Social History   Tobacco Use   Smoking status: Some Days    Packs/day: 1.50    Years: 49.00    Pack years: 73.50    Types: Pipe, Cigarettes    Last attempt to quit: 2019    Years since quitting: 3.7   Smokeless tobacco: Never  Vaping Use   Vaping Use: Never used  Substance Use Topics   Alcohol use: Not Currently   Drug use: Not Currently    Types: Marijuana    Review of Systems   Review  of Systems  Constitutional:  Negative for chills and fever.  Gastrointestinal:  Negative for abdominal pain, constipation, nausea and vomiting.  Genitourinary:  Positive for scrotal swelling.  All other systems reviewed and are negative.  Physical Exam Updated Vital Signs BP (!) 182/122 (BP Location: Right Arm)   Pulse 88   Temp 97.7 F (36.5 C) (Oral)   Resp (!) 24   SpO2 99%   Physical Exam Vitals and nursing note reviewed. Exam conducted with a chaperone present.  Constitutional:      General: He is in acute distress.     Appearance: Normal appearance.     Comments: Pt is writhing in pain, very distressed  HENT:     Head: Normocephalic and atraumatic.  Eyes:     General: No scleral icterus.    Conjunctiva/sclera: Conjunctivae normal.  Cardiovascular:     Rate and Rhythm: Tachycardia present.  Pulmonary:     Effort: Pulmonary effort is normal. No respiratory distress.     Breath sounds: Normal breath sounds. No wheezing.  Abdominal:     General: Abdomen is flat.     Palpations: Abdomen is soft.  Genitourinary:    Comments: Irreducible, hard, R sided inguinal hernia, tender to palpation, slight erythema Musculoskeletal:        General: No deformity or signs of injury.     Cervical back: Normal range of motion.  Skin:    Coloration: Skin is not jaundiced or pale.  Neurological:     General: No focal deficit present.     Mental Status: He is alert and oriented to person, place, and time. Mental status is at baseline.  Psychiatric:        Mood and Affect: Mood normal.        Behavior: Behavior normal.     LABS (all labs ordered are listed, but only abnormal results are displayed)  Labs Reviewed  BASIC METABOLIC PANEL - Abnormal; Notable for the following components:      Result Value   Potassium 3.4 (*)    Glucose, Bld 114 (*)    All other components within normal limits  CBC WITH DIFFERENTIAL/PLATELET -  Abnormal; Notable for the following components:   WBC  12.6 (*)    MCV 72.3 (*)    MCH 25.0 (*)    RDW 17.0 (*)    Lymphs Abs 4.2 (*)    All other components within normal limits  RESP PANEL BY RT-PCR (FLU A&B, COVID) ARPGX2   ____________________________________________  EKG  N/a ____________________________________________  RADIOLOGY Ky Barban, personally viewed and evaluated these images (plain radiographs) as part of my medical decision making, as well as reviewing the written report by the radiologist.  ED MD interpretation:  I reviewed the CT A/P which shows incarcerated R inguinal hernia, possible early bowel obstruction     ____________________________________________   PROCEDURES  Procedure(s) performed (including Critical Care):  Procedures   ____________________________________________   INITIAL IMPRESSION / ASSESSMENT AND PLAN / ED COURSE     70 yo male presents with incarcerated inguinal hernia. On arrival, pt in severe distress from pain. Inguinal hernia is not easily reduced and feels hard. Due to concern for strangulation, I consulted general surgery and obtained a CT, which showed the hernia with a possible early SBO. Dr. Maia Plan was able to reduce the hernia in the ED. He was then scheduled for elective surgery for tomorrow AM.       ____________________________________________   FINAL CLINICAL IMPRESSION(S) / ED DIAGNOSES  Final diagnoses:  Incarcerated hernia     ED Discharge Orders     None        Note:  This document was prepared using Dragon voice recognition software and may include unintentional dictation errors.    Georga Hacking, MD 06/15/21 [id number]

## 2021-06-15 NOTE — Interval H&P Note (Signed)
History and Physical Interval Note:  06/15/2021 12:06 PM  Tom Pace  has presented today for surgery, with the diagnosis of bilateral inguinal hernia.  The various methods of treatment have been discussed with the patient and family. After consideration of risks, benefits and other options for treatment, the patient has consented to  Procedure(s): XI ROBOTIC ASSISTED BILATERAL INGUINAL HERNIA (Bilateral) INSERTION OF MESH (Bilateral) as a surgical intervention.  The patient's history has been reviewed, patient examined, no change in status, stable for surgery.  I have reviewed the patient's chart and labs.  Questions were answered to the patient's satisfaction.     Carolan Shiver

## 2021-06-16 ENCOUNTER — Encounter: Payer: Self-pay | Admitting: General Surgery

## 2021-06-21 LAB — SURGICAL PATHOLOGY

## 2022-07-10 ENCOUNTER — Telehealth: Payer: Self-pay

## 2022-07-10 NOTE — Telephone Encounter (Signed)
Spoke with patient to schedule annual LDCT. Dr. Adrian Prows had sent [place] a reminder that patient is due for annual LDCT.  Patient did not wish to schedule today but has our number to call us when he is ready.

## 2024-02-18 LAB — COLOGUARD: COLOGUARD: NEGATIVE

## 2024-09-01 ENCOUNTER — Encounter: Payer: Self-pay | Admitting: General Surgery
